# Patient Record
Sex: Female | Born: 1960 | Race: White | Hispanic: No | Marital: Married | Smoking: Never smoker
Health system: Southern US, Community
[De-identification: ages and names within clinical notes are randomized; demographics above are authoritative.]

## PROBLEM LIST (undated history)

## (undated) DIAGNOSIS — F329 Major depressive disorder, single episode, unspecified: Secondary | ICD-10-CM

## (undated) DIAGNOSIS — E785 Hyperlipidemia, unspecified: Secondary | ICD-10-CM

## (undated) DIAGNOSIS — F419 Anxiety disorder, unspecified: Secondary | ICD-10-CM

## (undated) DIAGNOSIS — M797 Fibromyalgia: Secondary | ICD-10-CM

## (undated) DIAGNOSIS — H269 Unspecified cataract: Secondary | ICD-10-CM

## (undated) DIAGNOSIS — K219 Gastro-esophageal reflux disease without esophagitis: Secondary | ICD-10-CM

## (undated) DIAGNOSIS — F32A Depression, unspecified: Secondary | ICD-10-CM

## (undated) DIAGNOSIS — K8020 Calculus of gallbladder without cholecystitis without obstruction: Secondary | ICD-10-CM

## (undated) DIAGNOSIS — T7840XA Allergy, unspecified, initial encounter: Secondary | ICD-10-CM

## (undated) DIAGNOSIS — K589 Irritable bowel syndrome without diarrhea: Secondary | ICD-10-CM

## (undated) HISTORY — DX: Irritable bowel syndrome, unspecified: K58.9

## (undated) HISTORY — PX: LAPAROSCOPY: SHX197

## (undated) HISTORY — DX: Depression, unspecified: F32.A

## (undated) HISTORY — DX: Unspecified cataract: H26.9

## (undated) HISTORY — PX: CATARACT EXTRACTION: SUR2

## (undated) HISTORY — DX: Fibromyalgia: M79.7

## (undated) HISTORY — PX: WRIST FRACTURE SURGERY: SHX121

## (undated) HISTORY — PX: CHOLECYSTECTOMY: SHX55

## (undated) HISTORY — DX: Allergy, unspecified, initial encounter: T78.40XA

## (undated) HISTORY — PX: OVARIAN CYST REMOVAL: SHX89

## (undated) HISTORY — DX: Gastro-esophageal reflux disease without esophagitis: K21.9

## (undated) HISTORY — DX: Hyperlipidemia, unspecified: E78.5

## (undated) HISTORY — DX: Anxiety disorder, unspecified: F41.9

## (undated) HISTORY — DX: Major depressive disorder, single episode, unspecified: F32.9

## (undated) HISTORY — DX: Calculus of gallbladder without cholecystitis without obstruction: K80.20

---

## 1998-11-12 ENCOUNTER — Other Ambulatory Visit: Admission: RE | Admit: 1998-11-12 | Discharge: 1998-11-12 | Payer: Self-pay | Admitting: Obstetrics and Gynecology

## 1999-09-06 ENCOUNTER — Inpatient Hospital Stay (HOSPITAL_COMMUNITY): Admission: EM | Admit: 1999-09-06 | Discharge: 1999-09-10 | Payer: Self-pay | Admitting: Surgery

## 1999-09-06 ENCOUNTER — Encounter (INDEPENDENT_AMBULATORY_CARE_PROVIDER_SITE_OTHER): Payer: Self-pay | Admitting: Specialist

## 1999-10-07 ENCOUNTER — Encounter: Admission: RE | Admit: 1999-10-07 | Discharge: 1999-10-07 | Payer: Self-pay | Admitting: Family Medicine

## 1999-10-20 ENCOUNTER — Encounter: Admission: RE | Admit: 1999-10-20 | Discharge: 1999-10-20 | Payer: Self-pay | Admitting: Family Medicine

## 1999-10-20 ENCOUNTER — Encounter: Payer: Self-pay | Admitting: Family Medicine

## 2000-11-29 ENCOUNTER — Other Ambulatory Visit: Admission: RE | Admit: 2000-11-29 | Discharge: 2000-11-29 | Payer: Self-pay | Admitting: Obstetrics and Gynecology

## 2001-12-24 ENCOUNTER — Other Ambulatory Visit: Admission: RE | Admit: 2001-12-24 | Discharge: 2001-12-24 | Payer: Self-pay | Admitting: Obstetrics and Gynecology

## 2002-03-07 ENCOUNTER — Encounter (INDEPENDENT_AMBULATORY_CARE_PROVIDER_SITE_OTHER): Payer: Self-pay | Admitting: *Deleted

## 2002-03-07 ENCOUNTER — Ambulatory Visit (HOSPITAL_COMMUNITY): Admission: RE | Admit: 2002-03-07 | Discharge: 2002-03-07 | Payer: Self-pay | Admitting: Obstetrics and Gynecology

## 2003-01-07 ENCOUNTER — Other Ambulatory Visit: Admission: RE | Admit: 2003-01-07 | Discharge: 2003-01-07 | Payer: Self-pay | Admitting: Obstetrics and Gynecology

## 2004-01-26 ENCOUNTER — Other Ambulatory Visit: Admission: RE | Admit: 2004-01-26 | Discharge: 2004-01-26 | Payer: Self-pay | Admitting: Obstetrics and Gynecology

## 2005-02-22 ENCOUNTER — Other Ambulatory Visit: Admission: RE | Admit: 2005-02-22 | Discharge: 2005-02-22 | Payer: Self-pay | Admitting: Obstetrics and Gynecology

## 2005-08-17 ENCOUNTER — Encounter: Admission: RE | Admit: 2005-08-17 | Discharge: 2005-08-17 | Payer: Self-pay | Admitting: Otolaryngology

## 2007-04-25 HISTORY — PX: VAGINAL HYSTERECTOMY: SUR661

## 2007-05-07 ENCOUNTER — Ambulatory Visit: Payer: Self-pay | Admitting: Internal Medicine

## 2007-05-07 LAB — CONVERTED CEMR LAB
BUN: 9 mg/dL (ref 6–23)
Basophils Absolute: 0 10*3/uL (ref 0.0–0.1)
Basophils Relative: 0.2 % (ref 0.0–1.0)
CO2: 30 meq/L (ref 19–32)
Calcium: 9.8 mg/dL (ref 8.4–10.5)
Chloride: 105 meq/L (ref 96–112)
Creatinine, Ser: 0.8 mg/dL (ref 0.4–1.2)
Eosinophils Absolute: 0.1 10*3/uL (ref 0.0–0.6)
Eosinophils Relative: 1.5 % (ref 0.0–5.0)
GFR calc Af Amer: 100 mL/min
GFR calc non Af Amer: 82 mL/min
Glucose, Bld: 88 mg/dL (ref 70–99)
HCT: 39.8 % (ref 36.0–46.0)
Hemoglobin: 13.8 g/dL (ref 12.0–15.0)
Lymphocytes Relative: 17.9 % (ref 12.0–46.0)
MCHC: 34.7 g/dL (ref 30.0–36.0)
MCV: 93.2 fL (ref 78.0–100.0)
Monocytes Absolute: 0.5 10*3/uL (ref 0.2–0.7)
Monocytes Relative: 6.5 % (ref 3.0–11.0)
Neutro Abs: 5.5 10*3/uL (ref 1.4–7.7)
Neutrophils Relative %: 73.9 % (ref 43.0–77.0)
Platelets: 416 10*3/uL — ABNORMAL HIGH (ref 150–400)
Potassium: 4.9 meq/L (ref 3.5–5.1)
RBC: 4.27 M/uL (ref 3.87–5.11)
RDW: 12.1 % (ref 11.5–14.6)
Sodium: 140 meq/L (ref 135–145)
WBC: 7.4 10*3/uL (ref 4.5–10.5)

## 2007-05-09 ENCOUNTER — Ambulatory Visit: Payer: Self-pay | Admitting: Internal Medicine

## 2007-05-09 ENCOUNTER — Encounter: Payer: Self-pay | Admitting: Internal Medicine

## 2007-05-15 ENCOUNTER — Encounter (INDEPENDENT_AMBULATORY_CARE_PROVIDER_SITE_OTHER): Payer: Self-pay | Admitting: Obstetrics and Gynecology

## 2007-05-15 ENCOUNTER — Ambulatory Visit (HOSPITAL_COMMUNITY): Admission: RE | Admit: 2007-05-15 | Discharge: 2007-05-16 | Payer: Self-pay | Admitting: Obstetrics and Gynecology

## 2007-07-30 ENCOUNTER — Ambulatory Visit: Payer: Self-pay | Admitting: Internal Medicine

## 2008-04-06 DIAGNOSIS — F329 Major depressive disorder, single episode, unspecified: Secondary | ICD-10-CM | POA: Insufficient documentation

## 2008-04-06 DIAGNOSIS — F411 Generalized anxiety disorder: Secondary | ICD-10-CM | POA: Insufficient documentation

## 2008-04-06 DIAGNOSIS — IMO0001 Reserved for inherently not codable concepts without codable children: Secondary | ICD-10-CM | POA: Insufficient documentation

## 2008-04-06 DIAGNOSIS — K589 Irritable bowel syndrome without diarrhea: Secondary | ICD-10-CM | POA: Insufficient documentation

## 2008-04-06 DIAGNOSIS — F3289 Other specified depressive episodes: Secondary | ICD-10-CM | POA: Insufficient documentation

## 2009-03-02 ENCOUNTER — Ambulatory Visit (HOSPITAL_BASED_OUTPATIENT_CLINIC_OR_DEPARTMENT_OTHER): Admission: RE | Admit: 2009-03-02 | Discharge: 2009-03-02 | Payer: Self-pay | Admitting: Orthopedic Surgery

## 2011-04-06 LAB — POCT HEMOGLOBIN-HEMACUE: Hemoglobin: 13.1 g/dL (ref 12.0–15.0)

## 2011-05-09 NOTE — Assessment & Plan Note (Signed)
[facility]                         GASTROENTEROLOGY OFFICE NOTE   BRITTENY, FIEBELKORN                       MRN:          [id number]  DATE:07/30/2007                            DOB:          1961-06-11    HISTORY:  Patient presents today for followup.  She is a 50 year old  with a history of fibromyalgia, irritable bowel syndrome,  anxiety/depression, a prior cholecystectomy.  She was evaluated May 07, 2007 for problems with diarrhea.  CBC and comprehensive metabolic panel  were unremarkable.  Stool studies were negative.  She underwent complete  colonoscopy with intubation of the terminal ileum.  This was  unremarkable.  Random biopsies of the normal-appearing colonic mucosa  were unremarkable.  No evidence of microscopic colitis.  She was treated  with Lomotil, and asked to follow up after she recovered from her  hysterectomy.  She recovered uneventfully from her hysterectomy.  She  states she is having some problems with depression and stress.  Lomotil  did seem to help her diarrhea.  She was reluctant to take it regularly.  She had 3 weeks prior to going to work where she states she felt well  with no bowel complaints.  However, since going back to work she  continues with diarrhea.  This can occur most any time of the day.  It  seems to be exacerbated by meals.  Her appetite has been a bit off, and  she had lost a bit more weight.  No bleeding, fevers, or other problems.   MEDICATIONS:  Unchanged, and include Cymbalta, multivitamin, and  multiple supplements.  She was recently started on Premarin post  hysterectomy.   PHYSICAL EXAMINATION:  Finds a well-appearing female, in no acute  distress.  Blood pressure is 116/66.  Heart rate is 92.  Weight is 127.8 pounds  (decreased 6.4 pounds).  HEENT:  Sclerae anicteric.  Oral mucosa intact.  ABDOMEN:  Soft without tenderness, mass, or hernia.   IMPRESSION:  Ongoing problems with diarrhea.   Working diagnosis is  irritable bowel syndrome.  However, her weight loss might raise the  question of other, organic, causes.   RECOMMENDATIONS:  1. Encouraged to use Lomotil.  2. Empiric treatment with Flagyl 250 mg t.i.d. for 1 week.  3. Probiotic Align 1 p.o. daily for 2 weeks.  4. Office visit in 4 to 6 weeks.  If the patient is continuing to have      problems, then it may be reasonable to check a tissue      transglutaminase antibody as well as extend the workup for      diarrhea.     Wilhemina Bonito. Marina Goodell, MD  Electronically Signed   JNP/MedQ  DD: 07/30/2007  DT: 07/30/2007  Job #: [id number]   cc:   Gwen Pounds, MD  Juluis Mire, M.D.

## 2011-05-09 NOTE — Assessment & Plan Note (Signed)
[facility]                         GASTROENTEROLOGY OFFICE NOTE   Jillian Rodriguez, Jillian Rodriguez                       MRN:          [id number]  DATE:05/07/2007                            DOB:          July 28, 1961    REASON FOR CONSULTATION:  Diarrhea.   HISTORY:  This is a 50 year old white female with a history of  fibromyalgia, irritable bowel syndrome, anxiety/depression and prior  cholecystectomy.  She is referred through the courtesy of Dr. Timothy Lasso  regarding diarrhea.  The patient reports being diagnosed with irritable  bowel syndrome in 1994.  Her symptoms were allegedly secondary to job-  related stress.  After situational stress resolved, symptoms resolved.  She has, however, had intermittent problems with diarrhea and bloating  most of her life.  Also intermittent problems with constipation.  Over  the past 6 months, she has had more problems with diarrhea.  She  attributes this to the stress of a new job which she took in November.  She states she has difficulties with her boss.  She describes stress or  food-related urgency with loose stools.  Also describes burning  discomfort throughout the abdomen.  She is not specific.  She has had  decreased appetite and a 4-pound weight loss.  She has about 4-6 bowel  movements were day, occasionally bowel movements at night.  She denies  recent antibiotic exposure, exposure to persons with similar problems or  travel abroad.  She does not use sugar free foods or substitutes.  She  did take medical leave of absence on April 15, 2007.  Despite being off  work for several weeks, her symptoms have persisted.  GI review of  systems is otherwise remarkable for occasional reflux for which over-the-  counter medications help.  No dysphagia.  She has had no work-up  regarding this problem.  No prior endoscopic evaluations.  The patient  reports being scheduled for hysterectomy next week because of problems  with  symptomatic fibroids.   PAST MEDICAL HISTORY:  As above.   PAST SURGICAL HISTORY:  1. Cholecystectomy in September of 2000.  2. Tubal ligation about 3 years ago.  3. Tonsillectomy as a child.  4. History of uterine polyp removal.  5. Cesarean section.   ALLERGIES:  Possibly PENICILLIN.   CURRENT MEDICATIONS:  1. Cymbalta 120 mg daily.  2. Multivitamin.  3. Vitamin C.  4. Vitamin E.  5. Magnesium.  6. Potassium.  7. Calcium.  8. Fibro-X.  9. Loestrin.   FAMILY HISTORY:  Father with colon polyps.  No inflammatory bowel  disease.   SOCIAL HISTORY:  The patient is married with one son.  She lives with  her husband.  She graduated high school.  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  She does not smoke or use alcohol.   REVIEW OF SYSTEMS:  Multiple entries per diagnostic evaluation form.   PHYSICAL EXAMINATION:  GENERAL:  Well-appearing female in no acute  distress.  VITAL SIGNS:  Blood pressure 110/74, heart rate 80, weight 134.2 pounds.  She is 5 feet, 7 inches in  height.  HEENT:  Sclerae anicteric.  Conjunctivae are pink.  Oral mucosa intact.  There is no adenopathy.  LUNGS:  Clear.  HEART:  Regular.  ABDOMEN:  Soft without tenderness, mass or hernia.  EXTREMITIES:  Without edema.   IMPRESSION:  Chronic abdominal complaints, possibly irritable bowel.  More recent problems with loose stools, pain and mild weight loss with  some nocturnal component.  Rule out organic causes for symptoms.  Infectious etiology should be excluded.  As well,  rule out macroscopic  or microscopic colitis.   PLAN:  1. Laboratories today including CBC and comprehensive metabolic panel.  2. Stool studies for enteric pathogens, ova and parasites and      Clostridium difficile toxin.  3. Schedule colonoscopy with biopsies as soon as possible.  The nature      of the procedure, as well as the risks, benefits and alternatives      were reviewed.  She understood and agreed  to proceed.  4. Ongoing general medical care with Dr. Timothy Lasso and gynecologic care      with Dr. Arelia Sneddon.     Wilhemina Bonito. Marina Goodell, MD  Electronically Signed    JNP/MedQ  DD: 05/07/2007  DT: 05/08/2007  Job #: [id number]   cc:   Gwen Pounds, MD  Juluis Mire, M.D.

## 2011-05-09 NOTE — Op Note (Signed)
Jillian Rodriguez, Jillian Rodriguez NO.:  [id number]   MEDICAL RECORD NO.:  [id number]          PATIENT TYPE:  AMB   LOCATION:  DSC                          FACILITY:  [facility]   PHYSICIAN:  Cindee Salt, M.D.       DATE OF BIRTH:  October 05, 1961   DATE OF PROCEDURE:  03/02/2009  DATE OF DISCHARGE:                               OPERATIVE REPORT   PREOPERATIVE DIAGNOSIS:  Status post open reduction and internal  fixation with distal radius fracture DVR plate with wrist pain.   POSTOPERATIVE DIAGNOSIS:  Status post open reduction and internal  fixation with distal radius fracture DVR plate with wrist pain.   OPERATION:  Removal of DVR plate with debridement of FPL tendon, right  wrist.   SURGEON:  Cindee Salt, MD   ASSISTANT:  Carolyne Fiscal RN   ANESTHESIA:  General with local infiltration.   ANESTHESIOLOGIST:  Maren Beach, MD.   HISTORY:  The patient is a 49 year old female who 3 years ago suffered a  fracture her right distal radius.  She underwent open reduction and  internal fixation with DVR plate.  She recently had pain and swelling in  her forearm.  X-rays, CT scan revealed that the plate was slightly  distal.  Diagnosis was felt that she was abrading flexor tendons and  removal of plate should be performed.  Preoperative and postoperative  course have been discussed along with risks and complications.  She is  aware that there is no guarantee with the surgery, possibility of  infection, recurrence injury to arteries, nerves, tendons, incomplete  relief of symptoms, dystrophy.  Preoperative area, the patient is seen.  The extremity marked by both the patient and surgeon.  Antibiotic given.   PROCEDURE:  The patient was brought to the operating room where a  general anesthetic was carried out without difficulty under the  direction of Dr. Katrinka Blazing.  She was prepped using DuraPrep, supine  position, right arm free.  Time-out was taken confirming the patient and  procedure.  The  limb was exsanguinated with an Esmarch bandage.  Tourniquet was placed high and the arm was inflated to 250 mmHg.  The  old incision was used carried down through subcutaneous tissue.  Blood  was immediately apparent in the flexor carpi radialis tendon sheath  along with the FPL tendon sheath.  Dissection was carried down.  The  pronator quadratus was found to be atrophic.  This was then incised,  elevated off from the plate.  Significant blood was present around the  entire FPL tendon and muscle.  The undersurface of the FPL tendon was  inspected.  This was found to be abraded.  There was a hole in the  radial margin of the plate.  The tendon was minimally debrided.  The  plate was then removed without difficulty.  Periosteum was left intact.  The fracture was noted to be fully healed.  The periosteum was then  closed after irrigation and debridement of hypertrophic bone with figure-  of-eight 4-0 Vicryl sutures.  Subcutaneous tissue was closed with  interrupted 4-0 Vicryl and  the skin with a subcuticular 5-0 Vicryl  Rapide suture after infiltrating the area with 0.25% Marcaine without  epinephrine.  The completion of the closure, a sterile compressive  dressing and splint  to the wrist was applied.  The patient tolerated the procedure well and  was taken to the recovery room for observation in satisfactory  condition.  She will be discharged to home to return to the [facility]  [facility] [employer] in 1 week on Vicodin.           ______________________________  Cindee Salt, M.D.     GK/MEDQ  D:  03/02/2009  T:  03/02/2009  Job:  [id number]

## 2011-05-12 NOTE — H&P (Signed)
NAME:  Jillian Rodriguez, Jillian Rodriguez NO.:  [id number]   MEDICAL RECORD NO.:  [id number]          PATIENT TYPE:  AMB   LOCATION:  SDC                           FACILITY:  [facility]   PHYSICIAN:  Juluis Mire, M.D.   DATE OF BIRTH:  April 02, 1961   DATE OF ADMISSION:  DATE OF DISCHARGE:                              HISTORY & PHYSICAL   The patient is a 50 year old, gravida 3, para 1, abortus 2, married  female who presents for laparoscopic-assisted vaginal hysterectomy with  bilateral salpingo-oophorectomy.   In relation to present admission, cycles are regular.  At the present  time, she continues to have extremely heavy flow with her cycles with  clots.  She is also having pre- and post-menstrual spotting.  She has  had a previous laparoscopic evaluation done for tubal in the past with  finding of pelvic endometriosis.  She also had a hysteroscopy at that  time with removal of a benign polyp.  Subsequently, we did a repeat  saline infusion ultrasound.  She had multiple small fibroids, some of  which were submucosal.  She had bilateral ovarian cyst and a possible  recurrent polyp.  In view of these findings, we had discussed options  with her.  This included hormonal suppression, outpatient surgical  evaluation or hysterectomy which the patient is in favor for.  She  wishes to have both ovaries removed at the same time in order to have a  definitive cure for endometriosis.  She does know the potential for  vasomotor symptomatology that could require estrogen-replacement therapy  with associated risks and benefits.   IN TERMS OF ALLERGIES, SHE IS ALLERGIC PENICILLIN.   MEDICATIONS:  Cymbalta.   PAST MEDICAL HISTORY:  She has had usual childhood disease without any  significant sequelae.  She is being followed for fibromyalgia.   PAST SURGICAL HISTORY:  She had exploratory laparotomy in 1987 with  removal of a 6- to 7-cm ovarian cyst that was benign.  Also in 2003, she  had  laparoscopy with bilateral tubal ligation, treatment of  endometriosis, hysteroscopy, removal of benign polyp.   OBSTETRICAL HISTORY:  She has had three TABs and 1 cesarean section.   FAMILY HISTORY:  Noncontributory.   SOCIAL HISTORY:  Reveals no tobacco or alcohol use.   REVIEW OF SYSTEMS:  Noncontributory.   PHYSICAL EXAMINATION:  The patient is afebrile with stable vital signs.  HEENT EXAM:  The patient is normocephalic.  Pupils equal, round and  reactive to light and accommodation.  Extraocular movements were intact.  Sclerae and conjunctivae are clear.  Oropharynx clear.  NECK:  Without thyromegaly.  BREASTS:  No discrete masses.  LUNGS:  Are clear.  CARDIOVASCULAR SYSTEM:  Regular rhythm and rate without murmurs or  gallops.  ABDOMINAL EXAM:  Is benign.  No mass, organomegaly or  tenderness.  Well-healed low transverse incision.  PELVIC:  Normal external genitalia.  Vaginal mucosa clear.  Cervix  remarkable.  Uterus upper limits of normal size, irregular.  Adnexa  unremarkable.  EXTREMITIES:  Trace edema.  NEUROLOGICAL EXAM:  Grossly within normal limits.  IMPRESSION:  Menorrhagia with known uterine fibroid, endometrial polyps  and pelvic endometriosis.   PLAN:  After discussion of options, we are going to proceed with  laparoscopic-assisted vaginal hysterectomy with bilateral salpingo-  oophorectomy.  The risk have been discussed including the risk of  infection.  Risk of vascular injury that could lead to hemorrhage  requiring transfusion with the risk of AIDS or hepatitis, risk of injury  to adjacent organs including bladder, bowel, ureters that could require  further exploratory surgery.  Risk of deep venous thrombosis and  pulmonary emboli.  The patient expressed understanding of indications  and risks.      Juluis Mire, M.D.  Electronically Signed     JSM/MEDQ  D:  05/15/2007  T:  05/15/2007  Job:  [id number]

## 2011-05-12 NOTE — Op Note (Signed)
NAMELEYDY, WORTHEY NO.:  [id number]   MEDICAL RECORD NO.:  [id number]          PATIENT TYPE:  OIB   LOCATION:  9303                          FACILITY:  [facility]   PHYSICIAN:  Juluis Mire, M.D.   DATE OF BIRTH:  November 22, 1961   DATE OF PROCEDURE:  05/15/2007  DATE OF DISCHARGE:                               OPERATIVE REPORT   PREOPERATIVE DIAGNOSES:  1. Pelvic endometriosis.  2. Uterine fibroids.   POSTOPERATIVE DIAGNOSIS:  __________ right ovarian endometrioma.   OPERATIVE PROCEDURE:  Open laparoscopy, laparoscopic-assisted vaginal  hysterectomy with bilateral salpingo-oophorectomy.  Cystoscopy.   SURGEON:  Juluis Mire, M.D.   ASSISTANT:  Duke Salvia. Marcelle Overlie, M.D.   ANESTHESIA:  Was general endotracheal.   ESTIMATED BLOOD LOSS:  400 mL.   PACKS AND DRAINS:  None.   INTRAOPERATIVE BLOOD REPLACED:  None.   COMPLICATIONS:  None.   INDICATIONS:  Are noted in history and physical.   PROCEDURE WAS AS FOLLOWS:  The patient was taken to OR and placed in the  supine position.  After satisfactory level of general endotracheal  anesthesia was obtained, the patient was placed in a dorsal lithotomy  position using the Allen stirrups.  The abdomen, perineum and vagina  were prepped out with Betadine.  Bladder was emptied by in-and-out  catheterization.  A Hulka tenaculum was put in place and secured.  Patient was then draped in a sterile field.  Subumbilical incision made  with a knife and extended through the subcutaneous tissue.  Fascia was  entered sharply and incision in the fascia extended laterally.  Perineum  was entered with blunt pressure.  Open laparoscopic trocar was put in  place and secured.  The scope was introduced.  There was no evidence of  injury to adjacent organs.  Visualization revealed a uterus enlarged  with multiple fibroids.  Right ovary had apparent endometrioma.  Left  ovary was unremarkable.  She had a previous bilateral tubal  ligation.  Appendix was visualized and noted be normal.  Upper abdomen including  liver and tip of the gallbladder were clear.  We put in a 5-mm trocar in  the suprapubic area in the left lower quadrant after visualization of  the epigastric vessels.  We first attempted to isolate the right ovary  due to the size of the uterus and the large endometrioma.  We had  difficulty coming across the vasculature, so the decision was to leave  the ovary in place and do the hysterectomy and then come back and take  it out.  Therefore, the right utero-ovarian pedicle was cauterized  incised.  Right round ligament was cauterized incised.  We then went to  the left side.  The left ovary was elevated. The ureter could be seen,  its course below the ovary.  The ovarian vasculature was cauterized  incised.  Mesenteric attachment of the ovary and tube was cauterized  incised, and the left round ligament was cauterized incised.  At this  point in time, we had good hemostasis and decided to proceed vaginally.   At this point in  time, the abdomen was deflated of its carbon dioxide.  Laparoscope was removed.  The patient's legs were repositioned.  The  Hulka tenaculum was then taken out.  A weighted speculum was in the  vaginal vault.  The cervix was grasped with a Christella Hartigan tenaculum.  Cul-de-  sac was entered sharply.  Both uterosacral ligaments were clamped, cut  and suture ligated with 0 Vicryl.  The reflection of the vaginal mucosa  anteriorly was incised, and the bladder was dissected superiorly.  Paracervical tissue was clamped, cut and suture ligated with 0 Vicryl.  Using clamp, cut, tying technique with suture ligature of 0 Vicryl, the  parametrium was serially separated from the sides of the uterus.  We  could not exactly enter the vesicouterine space, but the bladder did  dissect up superiorly nicely.  We then began morcellating.  We first  excised the cervix and the lower uterine segment.  Then  excised portions  of the myometrium.  At this point, the main uterine fundus was delivered  through the vaginal incision.  Remaining pedicles were clamped and cut,  and the fundus was passed off the operative field along with the left  ovary.  These were sent for pathology.  Held pedicle was secured with  free ties of 0 Vicryl.  Areas of bleeding brought under control with  figure-of-eights of 0 Vicryl.  At this point in time, the vaginal mucosa  was reapproximated in a vertical fashion with interrupted figure-of-  eights of 0 Monocryl.  Foley was placed to straight drain.  We had clear  urine.   The patient's legs were repositioned.  Laparoscope was reintroduced.  At  this point in time, we were able to elevate the right ovary, identify  the ovarian vasculature above the ureter, and using cautery incision, we  separated the ovary from its sidewall attachment and was removed intact  through the umbilicus.  At this point in time, we thoroughly irrigated  the pelvis and had fairly good hemostasis.   At this point in time, cystoscopy was performed.  The patient had to be  given indigo carmine.  Bladder was intact with no evidence of injury.  Both ureteral orifices were visualized and noted to be spilling blue-  tinged urine.  The cystoscope was removed.  Foley was placed back to  straight drain.  Laparoscope was reintroduced.  Abdomen was deflated of  the carbon dioxide.  We had no active bleeding going on at the present  time.  We thoroughly irrigated the pelvis.  Abdomen was deflated of  carbon dioxide.  All trocars removed.  Subumbilical fascia was closed  with figure-of-eight of 0 Vicryl, skin with interrupted subcuticulars of  4-0 Vicryl.  Suprapubic incisions were closed with Dermabond.  Sponge,  instrument and needle count was reported as correct by circulating nurse x2.  Foley catheter was draining blue-tinged urine.  The patient was  extubated and transferred to the recovery room  in good condition.  The  patient tolerated the procedure well.      Juluis Mire, M.D.  Electronically Signed     JSM/MEDQ  D:  05/15/2007  T:  05/15/2007  Job:  [id number]

## 2011-05-12 NOTE — Discharge Summary (Signed)
[name], [name] NO.:  [id number]   MEDICAL RECORD NO.:  [id number]          PATIENT TYPE:  OIB   LOCATION:  9303                          FACILITY:  [facility]   PHYSICIAN:  Juluis Mire, M.D.   DATE OF BIRTH:  1961-07-04   DATE OF ADMISSION:  05/15/2007  DATE OF DISCHARGE:  05/16/2007                               DISCHARGE SUMMARY   PREOPERATIVE DIAGNOSES:  1. Endometriosis.  2. Uterine fibroids.   DISCHARGE DIAGNOSES:  1. Endometriosis.  2. Uterine fibroids.   OPERATIVE PROCEDURE:  Laparoscopic assisted vaginal hysterectomy with  bilateral salpingo-oophorectomy, cystoscopy.   For complete History and Physical, please see dictated note.   COURSE IN THE HOSPITAL:  The patient underwent the above-noted surgery.  She did relatively well.  Postoperatively she stayed overnight.  The  next morning her hemoglobin was 9.8, but she was relatively stable.  Her  Foley had been discontinued.  She was voiding without difficulty.  She  was tolerating a regular diet, bowel sounds were active, and she had  actually passed flatus.  Her incisions were clear.  She had no active  vaginal bleeding.  Did complain of some headache and some abdominal  discomfort.   In terms of complications, none.   Discharge home in stable condition.   DISPOSITION:  Patient is to avoid heavy lifting, vaginal entrance, be  careful driving her car.  She is to watch for signs of infection,  nausea, vomiting, increasing abdominal or pelvic pain, active vaginal  bleeding, signs of deep venous thrombosis or pulmonary embolus.  She  will be discharged home on Percocet she needs for pain.  Reassessment in  the office in one week.      Juluis Mire, M.D.  Electronically Signed     JSM/MEDQ  D:  05/16/2007  T:  05/16/2007  Job:  [id number]

## 2011-05-12 NOTE — Op Note (Signed)
[facility]  Patient:    Jillian Rodriguez, Jillian Rodriguez Visit Number: [id number] MRN: [id number]          Service Type: DSU Location: [facility] Attending Physician:  Frederich Balding Dictated by:   Juluis Mire, M.D. Proc. Date: 03/07/02 Admit Date:  03/07/2002 Discharge Date: 03/07/2002                             Operative Report  PREOPERATIVE DIAGNOSES:       1. Multiparity.                               2. Desires sterility.                               3. Abnormal uterine bleeding.                               4. Pelvic pain, rule out endometriosis.  POSTOPERATIVE DIAGNOSES:      1. Multiparity.                               2. Desires sterility.                               3. Abnormal uterine bleeding.                               4. Pelvic pain, rule out endometriosis.                               5. Moderate pelvic endometriosis.                               6. Multiple uterine polyps.                               7. Pelvic adhesions.  OPERATION:                    1. Hysteroscopy with resection of multiple                                  endometrial polyps.                               2. Diagnostic laparoscopy with bilateral tubal                                  fulguration.                               3. Lysis of adhesions.  4. Laser ablation of active endometriosis.  SURGEON:                      Juluis Mire, M.D.  ANESTHESIA:                   General endotracheal.  ESTIMATED BLOOD LOSS:         Minimal.  PACKS AND DRAINS:             None.  INTRAOPERATIVE BLOOD REPLACEMENT:                  None.  COMPLICATIONS:                None.  INDICATIONS:                  Noted in history and physical.  DESCRIPTION OF PROCEDURE:     The patient was taken to the OR and placed in the supine position. After satisfactory level of general endotracheal anesthesia was obtained, the patient was placed in  the dorsal lithotomy position using the Allen stirrups. The abdomen, perineum, and vagina were prepped out with Betadine. The patient was draped out for hysteroscopy. Speculum was placed in the vaginal vault. The cervix was grabbed with a single-tooth tenaculum. Uterus sounded to approximately 8 cm. The cervix was serially dilated to a size 35 Pratt dilator. The operative hysteroscope was introduced. Intrauterine cavity was involved with multiple uterine fibroids as noted through the hysteroscope. These were resected and sent for pathological review. There was no evidence of uterine perforation or active bleeding. Endometrial curettings were also obtained and sent for pathological review. At this point in time, we had no active bleeding or complications. A Hulka tenaculum was put in place, the single-tooth tenaculum and speculum then removed. The bladder was drained by in-and-out catheterization. The patient was the made ready for laparoscopy. A subumbilical incision was made with the knife. The Veress needle was introduced into the abdominal cavity. The abdomen was inflated with approximately 3 liters of carbon dioxide. The operating laparoscope was introduced. There was no evidence of injury to adjacent organs. The 5 mm trocar was put in place in the suprapubic area under direct visualization. Visualization revealed a normal appendix, upper abdomen including liver tip and gallbladder unremarkable. The exploration of the pelvic cavity revealed active endometriosis involving the bladder flap, cul-de-sac area and both ovaries. The right ovary was somewhat adherent from the previous ovarian cystectomy. First we went to the bilateral tubal ligation. Using a bipolar, a mid segment on each tube was grasped and coagulated for a distance of 2 cm. Coagulation was continued until the resistance read zero on the meter. Coagulation did extend out to the mesosalpinx. We then recoagulated the same  segment of tube completely desiccating each segment of tube. At the end of the procedure, both tubes were adequately coagulated. Next, the YAG laser was brought in with the rounded sapphire tip. First, we took down the adhesion involving the right ovary. Next, using the YAG laser 15 watts continuous mode, the area of endometriosis on the bladder flap area was coagulated. We made sure that we did not enter the bladder itself. We then coagulated the areas of endometriosis in the cul-de-sac and involving mainly the right ovary. There was no real endometrioma of the right ovary. Of note, there were no pelvic adhesions except to the right ovary. Classification of endometriosis would probably be mild to moderate. At the  end of the procedure, all active endometriosis was then adequately ablated. There was no active bleeding or signs of injury to adjacent organs. The abdomen was deflated of its carbon dioxide. All trocars were removed. Subumbilical incision was closed with interrupted subcuticular of 4-0 Vicryl, the suprapubic incisions closed with Steri-Strips. The Hulka tenaculum was then removed. Sponge, instrument, and needle count were reported as correct by circulating nurse x 2. The patient tolerated the procedure well, was extubated, and transferred to recovery room in good condition. Dictated by:   Juluis Mire, M.D. Attending Physician:  Frederich Balding DD:  04/22/02 TD:  04/22/02 Job: [id number]

## 2011-05-12 NOTE — H&P (Signed)
[facility]  Patient:    Jillian Rodriguez, Jillian Rodriguez Visit Number: [id number] MRN: [id number]          Service Type: Attending:  Juluis Mire, M.D. Dictated by:   Juluis Mire, M.D. Adm. Date:  03/07/02                           History and Physical  REASON FOR ADMISSION:         The patient is a 50 year old gravida 4 para 1 abortus 3 married white female who presents for laparoscopic bilateral tubal ligation as well as hysteroscopy.  HISTORY OF PRESENT ILLNESS:   The patient is desirous of permanent sterilization.  We have discussed with her alternatives for birth control purposes.  The potential irreversibility of sterilization is explained.  A failure rate of 1/200 is quoted.  Failures can be in the form of ectopic pregnancies requiring further surgical management.  She also has noted some abnormal bleeding.  Her cycle lengths are 30 to 32 days.  She is having pre and postmenstrual spotting.  We are going to do hysteroscopy to make sure there are no endometrial polyps or other etiology of the bleeding pattern. Finally, there are some problems with pelvic pain.  There has been some concern about endometriosis.  Therefore, laser standby will be available.  ALLERGIES:                    PENICILLIN.  MEDICATIONS:                  Lexapro.  PAST MEDICAL HISTORY:         Usual childhood diseases without any significant sequelae.  PAST SURGICAL HISTORY:        She did have an exploratory laparotomy in 1987 by Dr. Eda Paschal for evaluation of a 6-7 cm cyst.  This was removed and found to be benign.  There was no other pelvic pathology noted at that point in time.  It was felt that the uterus was slightly boggy, although no fibroid was identified.  OBSTETRICAL HISTORY:          She has had three TABs and one cesarean section.  FAMILY HISTORY:               Noncontributory.  SOCIAL HISTORY:               No tobacco or alcohol use.  REVIEW OF SYSTEMS:             Noncontributory.  PHYSICAL EXAMINATION:  VITAL SIGNS:                  Patient is afebrile with stable vital signs.  HEENT:                        Patient normocephalic.  Pupils equal, round, and reactive to light and accommodation.  Extraocular movements were intact. Sclerae and conjunctivae were clear.  Oropharynx was clear.  BREASTS:                      No discrete masses.  LUNGS:                        Clear.  CARDIOVASCULAR:               Regular rhythm and rate without murmurs or gallops.  ABDOMEN:  Benign.  No masses, organomegaly, or tenderness.  PELVIC:                       Normal external genitalia, vaginal mucosa is clear.  Cervix unremarkable.  Uterus normal size, shape, and contour.  Adnexa free of masses or tenderness.  EXTREMITIES:                  Trace edema.  NEUROLOGIC:                   Grossly within normal limits.  IMPRESSION:                   1. Desires sterility.                               2. Abnormal uterine bleeding.                               3. Pelvic pain, rule out endometriosis.  PLAN:                         The patient to undergo laparoscopic bilateral tubal ligation with laser standby, as well as hysteroscopy.  The risks have been discussed, including the risk of anesthesia; the risk of infection; the risk of vascular injury that could require transfusion with the risk of AIDS or hepatitis; the risk of injury to other organs including bladder or bowel that could require further exploratory surgery; the risk of deep venous thrombosis and pulmonary embolus.  The patient professed an understanding of the indications and risks. Dictated by:   Juluis Mire, M.D. Attending:  Juluis Mire, M.D. DD:  03/07/02 TD:  03/07/02 Job: [id number]

## 2014-11-25 ENCOUNTER — Ambulatory Visit (INDEPENDENT_AMBULATORY_CARE_PROVIDER_SITE_OTHER): Payer: BC Managed Care – PPO | Admitting: Emergency Medicine

## 2014-11-25 VITALS — BP 128/78 | HR 71 | Temp 98.2°F | Resp 16 | Ht 67.5 in | Wt 154.0 lb

## 2014-11-25 DIAGNOSIS — S93505A Unspecified sprain of left lesser toe(s), initial encounter: Secondary | ICD-10-CM

## 2014-11-25 DIAGNOSIS — M79675 Pain in left toe(s): Secondary | ICD-10-CM

## 2014-11-25 DIAGNOSIS — S93509A Unspecified sprain of unspecified toe(s), initial encounter: Secondary | ICD-10-CM

## 2014-11-25 MED ORDER — MELOXICAM 15 MG PO TABS: 15.0000 mg | ORAL_TABLET | Freq: Every day | ORAL | Status: DC

## 2014-11-25 NOTE — Progress Notes (Signed)
    MRN: [id number] DOB: 1961-09-25  Subjective:   Jillian Rodriguez is a 53 y.o. female presenting for 3 day history of new onset worsening 2nd left toe pain. Patient states that she first noticed the pain when she was stepping into her tub on Saturday, does not recall stubbing it or any other trauma. No new activities where she could have injured her toe. Today, patient reports that her pain is achy, moderate in severity, worsened with walking and associated with swelling in her toe. Denies any deformity, clicking, popping, erythema, ecchymosis, fevers, swelling of the foot. Has not tried any medications, no icing or heat. Does not smoke, no alcohol use. Denies any other aggravating or relieving factors, no other questions or concerns.   Prior to Admission medications   Medication Sig Start Date End Date Taking? Authorizing Provider  DULoxetine (CYMBALTA) 60 MG capsule Take 60 mg by mouth daily.   Yes Historical Provider, MD  estradiol (VIVELLE-DOT) 0.1 MG/24HR patch Place 1 patch onto the skin 2 (two) times a week.   Yes Historical Provider, MD  rOPINIRole (REQUIP) 0.25 MG tablet Take 0.25 mg by mouth 3 (three) times daily.   Yes Historical Provider, MD    Allergies  Allergen Reactions  . Penicillins Other (See Comments)    Unsure of reaction      Past Medical History  Diagnosis Date  . Allergy   . Depression   . Fibromyalgia     Past Surgical History  Procedure Laterality Date  . Cholecystectomy    . Cesarean section    . Abdominal hysterectomy      ROS As in subjective.  Objective:   Vitals: BP 128/78 mmHg  Pulse 71  Temp(Src) 98.2 F (36.8 C) (Oral)  Resp 16  Ht 5' 7.5" (1.715 m)  Wt 154 lb (69.854 kg)  BMI 23.75 kg/m2  SpO2 98%  Physical Exam  Constitutional: She is oriented to person, place, and time and well-developed, well-nourished, and in no distress.  Cardiovascular: Normal rate and intact distal pulses.   Pulmonary/Chest: Effort normal. No respiratory  distress.  Musculoskeletal: Normal range of motion. She exhibits edema (mild of 2nd left toe) and tenderness (mild-moderate of 2nd left toe).  Sensation intact, strength 5/5 in feet and toes bilaterally.  Neurological: She is alert and oriented to person, place, and time.  Skin: Skin is warm and dry. No rash noted. She is not diaphoretic. There is erythema (mild of 2nd left toe).   Assessment and Plan :   1. Toe sprain, initial encounter 2. Toe pain, left - Will manage conservatively, patient is okay holding off on the X-ray - Buddy tape for the next 2 weeks, meloxicam for pain, elevate foot while at work - Return to clinic if symptoms worsen or fail to resolve - meloxicam (MOBIC) 15 MG tablet; Take 1 tablet (15 mg total) by mouth daily.  Dispense: 30 tablet; Refill: 0   Jaynee Eagles, PA-C [facility] [phone] 11/25/2014 9:08 AM

## 2014-11-25 NOTE — Patient Instructions (Signed)
Buddy Taping of Toes °We have taped your toes together to keep them from moving. This is called "buddy taping" since we used a part of your own body to keep the injured part still. We placed soft padding between your toes to keep them from rubbing against each other. Buddy taping will help with healing and to reduce pain. Keep your toes buddy taped together for as long as directed by your caregiver. °HOME CARE INSTRUCTIONS  °· Raise your injured area above the level of your heart while sitting or lying down. Prop it up with pillows. °· An ice pack used every twenty minutes, while awake, for the first one to two days may be helpful. Put ice in a plastic bag and put a towel between the bag and your skin. °· Watch for signs that the taping is too tight. These signs may be: °¨ Numbness of your taped toes. °¨ Coolness of your taped toes. °¨ Color change in the area beyond the tape. °¨ Increased pain. °· If you have any of these signs, loosen or rewrap the tape. If you need to loosen or rewrap the buddy tape, make sure you use the padding again. °SEEK IMMEDIATE MEDICAL CARE IF:  °· You have worse pain, swelling, inflammation (soreness), drainage or bleeding after you rewrap the tape. °· Any new problems occur. °MAKE SURE YOU:  °· Understand these instructions. °· Will watch your condition. °· Will get help right away if you are not doing well or get worse. °Document Released: 09/14/2004 Document Revised: 03/04/2012 Document Reviewed: 12/08/2008 °ExitCare® Patient Information ©2015 [employer]. This information is not intended to replace advice given to you by your health care provider. Make sure you discuss any questions you have with your health care provider. ° °

## 2016-01-03 ENCOUNTER — Encounter: Payer: Self-pay | Admitting: Internal Medicine

## 2016-05-30 DIAGNOSIS — Z23 Encounter for immunization: Secondary | ICD-10-CM | POA: Diagnosis not present

## 2016-06-01 DIAGNOSIS — Z0000 Encounter for general adult medical examination without abnormal findings: Secondary | ICD-10-CM | POA: Diagnosis not present

## 2016-12-27 DIAGNOSIS — L570 Actinic keratosis: Secondary | ICD-10-CM | POA: Diagnosis not present

## 2016-12-27 DIAGNOSIS — L309 Dermatitis, unspecified: Secondary | ICD-10-CM | POA: Diagnosis not present

## 2016-12-27 DIAGNOSIS — D229 Melanocytic nevi, unspecified: Secondary | ICD-10-CM | POA: Diagnosis not present

## 2017-01-06 DIAGNOSIS — Z23 Encounter for immunization: Secondary | ICD-10-CM | POA: Diagnosis not present

## 2017-01-26 DIAGNOSIS — Z0000 Encounter for general adult medical examination without abnormal findings: Secondary | ICD-10-CM | POA: Diagnosis not present

## 2017-01-26 DIAGNOSIS — M859 Disorder of bone density and structure, unspecified: Secondary | ICD-10-CM | POA: Diagnosis not present

## 2017-01-26 DIAGNOSIS — R8299 Other abnormal findings in urine: Secondary | ICD-10-CM | POA: Diagnosis not present

## 2017-01-29 DIAGNOSIS — G2581 Restless legs syndrome: Secondary | ICD-10-CM | POA: Diagnosis not present

## 2017-01-29 DIAGNOSIS — Z1389 Encounter for screening for other disorder: Secondary | ICD-10-CM | POA: Diagnosis not present

## 2017-01-29 DIAGNOSIS — N3281 Overactive bladder: Secondary | ICD-10-CM | POA: Diagnosis not present

## 2017-01-29 DIAGNOSIS — R0683 Snoring: Secondary | ICD-10-CM | POA: Diagnosis not present

## 2017-01-29 DIAGNOSIS — R41840 Attention and concentration deficit: Secondary | ICD-10-CM | POA: Diagnosis not present

## 2017-01-29 DIAGNOSIS — Z0000 Encounter for general adult medical examination without abnormal findings: Secondary | ICD-10-CM | POA: Diagnosis not present

## 2017-03-21 ENCOUNTER — Encounter: Payer: Self-pay | Admitting: Internal Medicine

## 2017-05-10 DIAGNOSIS — Z01419 Encounter for gynecological examination (general) (routine) without abnormal findings: Secondary | ICD-10-CM | POA: Diagnosis not present

## 2017-05-10 DIAGNOSIS — Z1231 Encounter for screening mammogram for malignant neoplasm of breast: Secondary | ICD-10-CM | POA: Diagnosis not present

## 2017-05-10 DIAGNOSIS — Z6824 Body mass index (BMI) 24.0-24.9, adult: Secondary | ICD-10-CM | POA: Diagnosis not present

## 2017-07-23 DIAGNOSIS — Z0000 Encounter for general adult medical examination without abnormal findings: Secondary | ICD-10-CM | POA: Diagnosis not present

## 2017-07-24 ENCOUNTER — Ambulatory Visit (INDEPENDENT_AMBULATORY_CARE_PROVIDER_SITE_OTHER): Payer: BLUE CROSS/BLUE SHIELD | Admitting: Physician Assistant

## 2017-07-24 ENCOUNTER — Encounter: Payer: Self-pay | Admitting: Physician Assistant

## 2017-07-24 ENCOUNTER — Encounter (INDEPENDENT_AMBULATORY_CARE_PROVIDER_SITE_OTHER): Payer: Self-pay

## 2017-07-24 VITALS — BP 120/72 | HR 68 | Ht 67.5 in | Wt 160.0 lb

## 2017-07-24 DIAGNOSIS — K5900 Constipation, unspecified: Secondary | ICD-10-CM

## 2017-07-24 DIAGNOSIS — K644 Residual hemorrhoidal skin tags: Secondary | ICD-10-CM

## 2017-07-24 DIAGNOSIS — K5730 Diverticulosis of large intestine without perforation or abscess without bleeding: Secondary | ICD-10-CM

## 2017-07-24 DIAGNOSIS — K648 Other hemorrhoids: Secondary | ICD-10-CM

## 2017-07-24 DIAGNOSIS — K625 Hemorrhage of anus and rectum: Secondary | ICD-10-CM | POA: Diagnosis not present

## 2017-07-24 MED ORDER — SUPREP BOWEL PREP KIT 17.5-3.13-1.6 GM/177ML PO SOLN: ORAL | 0 refills | Status: DC

## 2017-07-24 MED ORDER — BENEFIBER PO POWD: ORAL | 0 refills | Status: DC

## 2017-07-24 NOTE — Patient Instructions (Signed)
You have been scheduled for a colonoscopy. Please follow written instructions given to you at your visit today.  Please pick up your prep supplies at the pharmacy within the next 1-3 days. If you use inhalers (even only as needed), please bring them with you on the day of your procedure. Your physician has requested that you go to www.startemmi.com and enter the access code given to you at your visit today. This web site gives a general overview about your procedure. However, you should still follow specific instructions given to you by our office regarding your preparation for the procedure.  We have sent the following medications to your pharmacy for you to pick up at your convenience: Suprep  Please purchase over the counter Benefiber and take as directed daily.  Be sure to increase your fluid intake.  Normal BMI (Body Mass Index- based on height and weight) is between 19 and 25. Your BMI today is Body mass index is 24.69 kg/m. Marland Kitchen Please consider follow up  regarding your BMI with your Primary Care Provider.

## 2017-07-24 NOTE — Progress Notes (Signed)
Assessment and plans for colonoscopy reviewed 

## 2017-07-24 NOTE — Progress Notes (Signed)
Subjective:    Patient ID: Jillian Rodriguez, female    DOB: Jun 08, 1961, 56 y.o.   MRN: [id number]  HPI Jillian Rodriguez is a pleasant 55 year old white female, known remotely to Dr. Henrene Pastor who comes in today with complaints of intermittent rectal bleeding. She is generally in good health with history of anxiety/depression, fibromyalgia. She had undergone colonoscopy for screening in 2008 with finding of left colon diverticulosis. No polyps. Patient says she has had long-term problems with mild constipation definite dating back many years but generally does not require any laxatives. She says she has noted a small amount of blood off and on on the tissue over the past few years but more recently she has had 2 occasions of seeing significantly larger amount of red blood actually in the commode after a bowel movement . She denies any abdominal pain or rectal pain. She has not had any changes in her bowel habits. She does have pellet-like stools most of the time. Family history is negative for colon cancer as far she is aware.  Review of Systems Pertinent positive and negative review of systems were noted in the above HPI section.  All other review of systems was otherwise negative.  Outpatient Encounter Prescriptions as of 07/24/2017  Medication Sig  . b complex vitamins tablet Take 1 tablet by mouth daily.  Marland Kitchen CALCIUM-MAGNESIUM-ZINC PO Take 1 tablet by mouth daily.  . Cholecalciferol (VITAMIN D3 PO) Take by mouth. 3000 daily  . DULoxetine (CYMBALTA) 60 MG capsule Take 60 mg by mouth daily.  Marland Kitchen estradiol (MINIVELLE) 0.1 MG/24HR patch Place 1 patch onto the skin 2 (two) times a week.  . MULTIPLE VITAMIN PO Take 1 tablet by mouth daily.  . Omega-3 Fatty Acids (FISH OIL PO) Take 1 capsule by mouth daily.  Marland Kitchen rOPINIRole (REQUIP) 0.25 MG tablet Take 0.25 mg by mouth 3 (three) times daily.  Manus Gunning BOWEL PREP KIT 17.5-3.13-1.6 GM/180ML SOLN Suprep-Use as directed  . Wheat Dextrin (BENEFIBER) POWD Take daily as  directed  . [DISCONTINUED] estradiol (VIVELLE-DOT) 0.1 MG/24HR patch Place 1 patch onto the skin 2 (two) times a week.  . [DISCONTINUED] meloxicam (MOBIC) 15 MG tablet Take 1 tablet (15 mg total) by mouth daily.  . [DISCONTINUED] SUPREP BOWEL PREP KIT 17.5-3.13-1.6 GM/180ML SOLN Suprep-Use as directed   No facility-administered encounter medications on file as of 07/24/2017.    Allergies  Allergen Reactions  . Penicillins Other (See Comments)    Unsure of reaction     Patient Active Problem List   Diagnosis Date Noted  . Diverticulosis of colon without hemorrhage 07/24/2017  . ANXIETY 04/06/2008  . DEPRESSION 04/06/2008  . IBS 04/06/2008  . FIBROMYALGIA 04/06/2008   Social History   Social History  . Marital status: Married    Spouse name: N/A  . Number of children: 1  . Years of education: N/A   Occupational History  . loan processor    Social History Main Topics  . Smoking status: Never Smoker  . Smokeless tobacco: Never Used  . Alcohol use No  . Drug use: No  . Sexual activity: Not on file   Other Topics Concern  . Not on file   Social History Narrative  . No narrative on file    Jillian Rodriguez's family history includes Breast cancer in her paternal grandmother; Colon polyps in her father; Heart disease in her father, maternal grandfather, and paternal uncle; Hypertension in her father; Kidney disease in her father.  Objective:    Vitals:   07/24/17 0825  BP: 120/72  Pulse: 68    Physical Exam  well-developed white female in no acute distress, quite pleasant blood pressure 120/72 pulse 68, height 5 foot 7 weight 160, BMI 24.6. HEENT; nontraumatic, cephalic EOMI PERRLA sclera anicteric, Cardiovascular; regular rate and rhythm with S1-S2 no murmur or gallop, Pulmonary ;clear bilaterally, Abdomen; soft, nontender no palpable mass or hepatosplenomegaly bowel sounds are present, Rectal ;exam she does have small noninflamed external hemorrhoids and one mildly  inflamed external hemorrhoid, on digital exam stool is heme-negative and has one palpable internal hemorrhoid no lesion felt, Extremities; no clubbing cyanosis or edema skin warm and dry, Neuropsych ;mood and affect appropriate       Assessment & Plan:   #71 57 year old white female with recent intermittent bright red blood per rectum with 2 episodes of significant blood in the commode. Last colonoscopy 2008 pertinent only for diverticulosis Bleeding may be secondary to internal hemorrhoids, rule out occult colon lesion #2 history of diverticulosis #3 small external hemorrhoids non-inflamed #4 fibromyalgia  Plan  ; start Benefiber 1 dose daily in a glass of juice or water Patient will be scheduled for colonoscopy with Dr. Henrene Pastor. Procedure discussed in detail with patient including risks and benefits and she is agreeable to proceed. Patient was offered a trial of Anusol HC suppositories-she declines at present, and says she's not worried if the bleeding is coming from hemorrhoids she just wants to make sure there is not any other issue.  Bacilio Abascal S Elmor Kost PA-C 07/24/2017   Cc: Shon Baton, MD

## 2017-07-30 DIAGNOSIS — L309 Dermatitis, unspecified: Secondary | ICD-10-CM | POA: Diagnosis not present

## 2017-08-13 ENCOUNTER — Encounter: Payer: Self-pay | Admitting: Internal Medicine

## 2017-08-23 ENCOUNTER — Telehealth: Payer: Self-pay

## 2017-08-23 NOTE — Telephone Encounter (Signed)
Left message for patient that per Amy Hazlewood, her colonoscopy would be considered diagnostic because of her diagnoses of rectal bleeding and constipation.  I told her to call with questions.

## 2017-08-23 NOTE — Telephone Encounter (Signed)
Patient returned phone call. Best # [phone]

## 2017-08-24 ENCOUNTER — Encounter: Payer: Self-pay | Admitting: Internal Medicine

## 2017-08-24 ENCOUNTER — Ambulatory Visit (AMBULATORY_SURGERY_CENTER): Payer: BLUE CROSS/BLUE SHIELD | Admitting: Internal Medicine

## 2017-08-24 VITALS — BP 121/73 | HR 63 | Temp 97.7°F | Resp 13 | Ht 67.0 in | Wt 160.0 lb

## 2017-08-24 DIAGNOSIS — K5730 Diverticulosis of large intestine without perforation or abscess without bleeding: Secondary | ICD-10-CM

## 2017-08-24 DIAGNOSIS — Z1211 Encounter for screening for malignant neoplasm of colon: Secondary | ICD-10-CM

## 2017-08-24 DIAGNOSIS — K51411 Inflammatory polyps of colon with rectal bleeding: Secondary | ICD-10-CM | POA: Diagnosis not present

## 2017-08-24 DIAGNOSIS — K635 Polyp of colon: Secondary | ICD-10-CM | POA: Diagnosis not present

## 2017-08-24 DIAGNOSIS — D123 Benign neoplasm of transverse colon: Secondary | ICD-10-CM

## 2017-08-24 DIAGNOSIS — K5900 Constipation, unspecified: Secondary | ICD-10-CM

## 2017-08-24 DIAGNOSIS — K625 Hemorrhage of anus and rectum: Secondary | ICD-10-CM

## 2017-08-24 MED ORDER — SODIUM CHLORIDE 0.9 % IV SOLN: 500.0000 mL | INTRAVENOUS | Status: DC

## 2017-08-24 NOTE — Progress Notes (Signed)
Called to room to assist during endoscopic procedure.  Patient ID and intended procedure confirmed with present staff. Received instructions for my participation in the procedure from the performing physician.  

## 2017-08-24 NOTE — Progress Notes (Signed)
A/ox3 pleased with MAC, report to Sheila RN 

## 2017-08-24 NOTE — Op Note (Signed)
[facility] Patient Name: Jillian Rodriguez Procedure Date: 08/24/2017 3:21 PM MRN: [id number] Endoscopist: Docia Chuck. Henrene Pastor , MD Age: 56 Referring MD:  Date of Birth: [birth year] Gender: Female Account #: [id number] Procedure:                Colonoscopy, with cold snare polypectomy x 1 Indications:              Screening for colorectal malignant neoplasm Medicines:                Monitored Anesthesia Care Procedure:                Pre-Anesthesia Assessment:                           - Prior to the procedure, a History and Physical                            was performed, and patient medications and                            allergies were reviewed. The patient's tolerance of                            previous anesthesia was also reviewed. The risks                            and benefits of the procedure and the sedation                            options and risks were discussed with the patient.                            All questions were answered, and informed consent                            was obtained. Prior Anticoagulants: The patient has                            taken no previous anticoagulant or antiplatelet                            agents. ASA Grade Assessment: II - A patient with                            mild systemic disease. After reviewing the risks                            and benefits, the patient was deemed in                            satisfactory condition to undergo the procedure.                           After obtaining informed consent, the colonoscope  was passed under direct vision. Throughout the                            procedure, the patient's blood pressure, pulse, and                            oxygen saturations were monitored continuously. The                            Colonoscope was introduced through the anus and                            advanced to the the cecum, identified by       appendiceal orifice and ileocecal valve. The                            ileocecal valve, appendiceal orifice, and rectum                            were photographed. The quality of the bowel                            preparation was excellent. The colonoscopy was                            performed without difficulty. The patient tolerated                            the procedure well. The bowel preparation used was                            SUPREP. Scope In: 3:26:54 PM Scope Out: 3:41:42 PM Scope Withdrawal Time: 0 hours 11 minutes 56 seconds  Total Procedure Duration: 0 hours 14 minutes 48 seconds  Findings:                 A 2 mm polyp was found in the hepatic flexure. The                            polyp was removed with a cold snare. Resection and                            retrieval were complete.                           Multiple small and large-mouthed diverticula were                            found in the sigmoid colon.                           Internal hemorrhoids were found during retroflexion.                           The exam was otherwise without abnormality  on                            direct and retroflexion views. Complications:            No immediate complications. Estimated blood loss:                            None. Estimated Blood Loss:     Estimated blood loss: none. Impression:               - One 2 mm polyp at the hepatic flexure, removed                            with a cold snare. Resected and retrieved.                           - Diverticulosis in the sigmoid colon.                           - Internal hemorrhoids.                           - The examination was otherwise normal on direct                            and retroflexion views. Recommendation:           - Repeat colonoscopy in 5-10 years for surveillance.                           - Patient has a contact number available for                            emergencies. The signs and  symptoms of potential                            delayed complications were discussed with the                            patient. Return to normal activities tomorrow.                            Written discharge instructions were provided to the                            patient.                           - Resume previous diet.                           - Continue present medications.                           - Await pathology results. Docia Chuck. Henrene Pastor, MD 08/24/2017 3:45:57 PM This report has been signed electronically.

## 2017-08-24 NOTE — Patient Instructions (Signed)
YOU HAD AN ENDOSCOPIC PROCEDURE TODAY AT [facility]:   Refer to the procedure report that was given to you for any specific questions about what was found during the examination.  If the procedure report does not answer your questions, please call your gastroenterologist to clarify.  If you requested that your care partner not be given the details of your procedure findings, then the procedure report has been included in a sealed envelope for you to review at your convenience later.  YOU SHOULD EXPECT: Some feelings of bloating in the abdomen. Passage of more gas than usual.  Walking can help get rid of the air that was put into your GI tract during the procedure and reduce the bloating. If you had a lower endoscopy (such as a colonoscopy or flexible sigmoidoscopy) you may notice spotting of blood in your stool or on the toilet paper. If you underwent a bowel prep for your procedure, you may not have a normal bowel movement for a few days.  Please Note:  You might notice some irritation and congestion in your nose or some drainage.  This is from the oxygen used during your procedure.  There is no need for concern and it should clear up in a day or so.  SYMPTOMS TO REPORT IMMEDIATELY:   Following lower endoscopy (colonoscopy or flexible sigmoidoscopy):  Excessive amounts of blood in the stool  Significant tenderness or worsening of abdominal pains  Swelling of the abdomen that is new, acute  Fever of 100F or higher    For urgent or emergent issues, a gastroenterologist can be reached at any hour by calling [phone].   DIET:  We do recommend a small meal at first, but then you may proceed to your regular diet.  Drink plenty of fluids but you should avoid alcoholic beverages for 24 hours.  ACTIVITY:  You should plan to take it easy for the rest of today and you should NOT DRIVE or use heavy machinery until tomorrow (because of the sedation medicines used during the test).     FOLLOW UP: Our staff will call the number listed on your records the next business day following your procedure to check on you and address any questions or concerns that you may have regarding the information given to you following your procedure. If we do not reach you, we will leave a message.  However, if you are feeling well and you are not experiencing any problems, there is no need to return our call.  We will assume that you have returned to your regular daily activities without incident.  If any biopsies were taken you will be contacted by phone or by letter within the next 1-3 weeks.  Please call us at [phone] if you have not heard about the biopsies in 3 weeks.    SIGNATURES/CONFIDENTIALITY: You and/or your care partner have signed paperwork which will be entered into your electronic medical record.  These signatures attest to the fact that that the information above on your After Visit Summary has been reviewed and is understood.  Full responsibility of the confidentiality of this discharge information lies with you and/or your care-partner.  Resume medications. Information given on polyps,diverticulosis and hemorrhoids.

## 2017-08-24 NOTE — Progress Notes (Signed)
Pt's states no medical or surgical changes since previsit or office visit. 

## 2017-08-28 ENCOUNTER — Telehealth: Payer: Self-pay

## 2017-08-28 NOTE — Telephone Encounter (Signed)
  Follow up Call-  Call back number 08/24/2017  Post procedure Call Back phone  # [phone]  Permission to leave phone message Yes  Some recent data might be hidden     Patient questions:  Do you have a fever, pain , or abdominal swelling? No. Pain Score  0 *  Have you tolerated food without any problems? Yes.    Have you been able to return to your normal activities? Yes.    Do you have any questions about your discharge instructions: Diet   No. Medications  No. Follow up visit  No.  Do you have questions or concerns about your Care? No.  Actions: * If pain score is 4 or above: No action needed, pain <4.

## 2017-09-05 ENCOUNTER — Encounter: Payer: Self-pay | Admitting: Internal Medicine

## 2017-10-05 DIAGNOSIS — Z23 Encounter for immunization: Secondary | ICD-10-CM | POA: Diagnosis not present

## 2017-10-22 DIAGNOSIS — Z6824 Body mass index (BMI) 24.0-24.9, adult: Secondary | ICD-10-CM | POA: Diagnosis not present

## 2017-10-22 DIAGNOSIS — R413 Other amnesia: Secondary | ICD-10-CM | POA: Diagnosis not present

## 2017-10-22 DIAGNOSIS — R030 Elevated blood-pressure reading, without diagnosis of hypertension: Secondary | ICD-10-CM | POA: Diagnosis not present

## 2017-10-22 DIAGNOSIS — F418 Other specified anxiety disorders: Secondary | ICD-10-CM | POA: Diagnosis not present

## 2017-11-01 DIAGNOSIS — H748X1 Other specified disorders of right middle ear and mastoid: Secondary | ICD-10-CM | POA: Diagnosis not present

## 2017-11-01 DIAGNOSIS — Z6824 Body mass index (BMI) 24.0-24.9, adult: Secondary | ICD-10-CM | POA: Diagnosis not present

## 2017-11-01 DIAGNOSIS — J069 Acute upper respiratory infection, unspecified: Secondary | ICD-10-CM | POA: Diagnosis not present

## 2017-11-01 DIAGNOSIS — F418 Other specified anxiety disorders: Secondary | ICD-10-CM | POA: Diagnosis not present

## 2018-01-25 ENCOUNTER — Encounter (HOSPITAL_COMMUNITY): Payer: Self-pay

## 2018-01-25 ENCOUNTER — Emergency Department (HOSPITAL_COMMUNITY)
Admission: EM | Admit: 2018-01-25 | Discharge: 2018-01-25 | Disposition: A | Discharge status: Home / Self Care | Payer: BLUE CROSS/BLUE SHIELD | Attending: Emergency Medicine | Admitting: Emergency Medicine

## 2018-01-25 ENCOUNTER — Other Ambulatory Visit: Payer: Self-pay

## 2018-01-25 ENCOUNTER — Emergency Department (HOSPITAL_COMMUNITY): Payer: BLUE CROSS/BLUE SHIELD

## 2018-01-25 DIAGNOSIS — F419 Anxiety disorder, unspecified: Secondary | ICD-10-CM | POA: Diagnosis not present

## 2018-01-25 DIAGNOSIS — Z79899 Other long term (current) drug therapy: Secondary | ICD-10-CM | POA: Diagnosis not present

## 2018-01-25 DIAGNOSIS — Z0000 Encounter for general adult medical examination without abnormal findings: Secondary | ICD-10-CM | POA: Diagnosis not present

## 2018-01-25 DIAGNOSIS — F418 Other specified anxiety disorders: Secondary | ICD-10-CM | POA: Diagnosis not present

## 2018-01-25 DIAGNOSIS — M545 Low back pain: Secondary | ICD-10-CM | POA: Diagnosis not present

## 2018-01-25 DIAGNOSIS — R946 Abnormal results of thyroid function studies: Secondary | ICD-10-CM | POA: Diagnosis not present

## 2018-01-25 DIAGNOSIS — S5001XA Contusion of right elbow, initial encounter: Secondary | ICD-10-CM | POA: Diagnosis not present

## 2018-01-25 DIAGNOSIS — Y999 Unspecified external cause status: Secondary | ICD-10-CM | POA: Insufficient documentation

## 2018-01-25 DIAGNOSIS — S59901A Unspecified injury of right elbow, initial encounter: Secondary | ICD-10-CM | POA: Diagnosis not present

## 2018-01-25 DIAGNOSIS — R51 Headache: Secondary | ICD-10-CM | POA: Insufficient documentation

## 2018-01-25 DIAGNOSIS — M25512 Pain in left shoulder: Secondary | ICD-10-CM | POA: Insufficient documentation

## 2018-01-25 DIAGNOSIS — R82998 Other abnormal findings in urine: Secondary | ICD-10-CM | POA: Diagnosis not present

## 2018-01-25 DIAGNOSIS — Y92410 Unspecified street and highway as the place of occurrence of the external cause: Secondary | ICD-10-CM | POA: Insufficient documentation

## 2018-01-25 DIAGNOSIS — Y939 Activity, unspecified: Secondary | ICD-10-CM | POA: Insufficient documentation

## 2018-01-25 DIAGNOSIS — S29012A Strain of muscle and tendon of back wall of thorax, initial encounter: Secondary | ICD-10-CM | POA: Diagnosis not present

## 2018-01-25 DIAGNOSIS — M859 Disorder of bone density and structure, unspecified: Secondary | ICD-10-CM | POA: Diagnosis not present

## 2018-01-25 DIAGNOSIS — T148XXA Other injury of unspecified body region, initial encounter: Secondary | ICD-10-CM

## 2018-01-25 DIAGNOSIS — M25521 Pain in right elbow: Secondary | ICD-10-CM | POA: Diagnosis not present

## 2018-01-25 MED ORDER — CYCLOBENZAPRINE HCL 10 MG PO TABS
10.0000 mg | ORAL_TABLET | Freq: Once | ORAL | Status: AC
  Administered 2018-01-25: 10 mg via ORAL
  Filled 2018-01-25: qty 1

## 2018-01-25 MED ORDER — CYCLOBENZAPRINE HCL 10 MG PO TABS: 10.0000 mg | ORAL_TABLET | Freq: Two times a day (BID) | ORAL | 0 refills | Status: DC | PRN

## 2018-01-25 MED ORDER — TRAMADOL HCL 50 MG PO TABS
50.0000 mg | ORAL_TABLET | Freq: Once | ORAL | Status: AC
  Administered 2018-01-25: 50 mg via ORAL
  Filled 2018-01-25: qty 1

## 2018-01-25 NOTE — ED Notes (Signed)
PT DISCHARGED. INSTRUCTIONS AND PRESCRIPTION GIVEN. AAOX4. PT IN NO APPARENT DISTRESS OR PAIN. THE OPPORTUNITY TO ASK QUESTIONS WAS PROVIDED. 
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
## 2018-01-25 NOTE — Discharge Instructions (Signed)
Do not take the muscle relaxer if you are driving as it will make you sleepy. Follow up with your doctor in one week for recheck. Return here sooner for any problems.

## 2018-02-01 DIAGNOSIS — R030 Elevated blood-pressure reading, without diagnosis of hypertension: Secondary | ICD-10-CM | POA: Diagnosis not present

## 2018-02-01 DIAGNOSIS — R0683 Snoring: Secondary | ICD-10-CM | POA: Diagnosis not present

## 2018-02-01 DIAGNOSIS — Z1389 Encounter for screening for other disorder: Secondary | ICD-10-CM | POA: Diagnosis not present

## 2018-02-01 DIAGNOSIS — G2581 Restless legs syndrome: Secondary | ICD-10-CM | POA: Diagnosis not present

## 2018-02-01 DIAGNOSIS — N3281 Overactive bladder: Secondary | ICD-10-CM | POA: Diagnosis not present

## 2018-02-01 DIAGNOSIS — Z0000 Encounter for general adult medical examination without abnormal findings: Secondary | ICD-10-CM | POA: Diagnosis not present

## 2018-03-11 DIAGNOSIS — M791 Myalgia, unspecified site: Secondary | ICD-10-CM | POA: Diagnosis not present

## 2018-07-10 DIAGNOSIS — Z0000 Encounter for general adult medical examination without abnormal findings: Secondary | ICD-10-CM | POA: Diagnosis not present

## 2018-08-07 DIAGNOSIS — Z01419 Encounter for gynecological examination (general) (routine) without abnormal findings: Secondary | ICD-10-CM | POA: Diagnosis not present

## 2018-08-07 DIAGNOSIS — Z6824 Body mass index (BMI) 24.0-24.9, adult: Secondary | ICD-10-CM | POA: Diagnosis not present

## 2018-08-07 DIAGNOSIS — Z808 Family history of malignant neoplasm of other organs or systems: Secondary | ICD-10-CM | POA: Diagnosis not present

## 2018-08-07 DIAGNOSIS — Z13820 Encounter for screening for osteoporosis: Secondary | ICD-10-CM | POA: Diagnosis not present

## 2018-08-07 DIAGNOSIS — Z801 Family history of malignant neoplasm of trachea, bronchus and lung: Secondary | ICD-10-CM | POA: Diagnosis not present

## 2018-08-07 DIAGNOSIS — Z803 Family history of malignant neoplasm of breast: Secondary | ICD-10-CM | POA: Diagnosis not present

## 2018-10-01 DIAGNOSIS — D229 Melanocytic nevi, unspecified: Secondary | ICD-10-CM | POA: Diagnosis not present

## 2018-10-01 DIAGNOSIS — L304 Erythema intertrigo: Secondary | ICD-10-CM | POA: Diagnosis not present

## 2018-10-02 DIAGNOSIS — Z23 Encounter for immunization: Secondary | ICD-10-CM | POA: Diagnosis not present

## 2018-11-05 DIAGNOSIS — H02831 Dermatochalasis of right upper eyelid: Secondary | ICD-10-CM | POA: Diagnosis not present

## 2018-11-05 DIAGNOSIS — H2513 Age-related nuclear cataract, bilateral: Secondary | ICD-10-CM | POA: Diagnosis not present

## 2018-11-05 DIAGNOSIS — H25013 Cortical age-related cataract, bilateral: Secondary | ICD-10-CM | POA: Diagnosis not present

## 2018-11-05 DIAGNOSIS — H2511 Age-related nuclear cataract, right eye: Secondary | ICD-10-CM | POA: Diagnosis not present

## 2018-11-05 DIAGNOSIS — H18413 Arcus senilis, bilateral: Secondary | ICD-10-CM | POA: Diagnosis not present

## 2018-12-04 DIAGNOSIS — Z961 Presence of intraocular lens: Secondary | ICD-10-CM | POA: Diagnosis not present

## 2018-12-04 DIAGNOSIS — H2511 Age-related nuclear cataract, right eye: Secondary | ICD-10-CM | POA: Diagnosis not present

## 2018-12-05 DIAGNOSIS — H2512 Age-related nuclear cataract, left eye: Secondary | ICD-10-CM | POA: Diagnosis not present

## 2018-12-05 DIAGNOSIS — H25042 Posterior subcapsular polar age-related cataract, left eye: Secondary | ICD-10-CM | POA: Diagnosis not present

## 2018-12-05 DIAGNOSIS — H25012 Cortical age-related cataract, left eye: Secondary | ICD-10-CM | POA: Diagnosis not present

## 2018-12-23 DIAGNOSIS — Z961 Presence of intraocular lens: Secondary | ICD-10-CM | POA: Diagnosis not present

## 2018-12-23 DIAGNOSIS — H2512 Age-related nuclear cataract, left eye: Secondary | ICD-10-CM | POA: Diagnosis not present

## 2019-01-14 DIAGNOSIS — J040 Acute laryngitis: Secondary | ICD-10-CM | POA: Diagnosis not present

## 2019-02-04 DIAGNOSIS — R946 Abnormal results of thyroid function studies: Secondary | ICD-10-CM | POA: Diagnosis not present

## 2019-02-04 DIAGNOSIS — M859 Disorder of bone density and structure, unspecified: Secondary | ICD-10-CM | POA: Diagnosis not present

## 2019-02-04 DIAGNOSIS — R82998 Other abnormal findings in urine: Secondary | ICD-10-CM | POA: Diagnosis not present

## 2019-02-04 DIAGNOSIS — Z0000 Encounter for general adult medical examination without abnormal findings: Secondary | ICD-10-CM | POA: Diagnosis not present

## 2019-02-11 DIAGNOSIS — F329 Major depressive disorder, single episode, unspecified: Secondary | ICD-10-CM | POA: Diagnosis not present

## 2019-02-11 DIAGNOSIS — Z1331 Encounter for screening for depression: Secondary | ICD-10-CM | POA: Diagnosis not present

## 2019-02-11 DIAGNOSIS — Z0000 Encounter for general adult medical examination without abnormal findings: Secondary | ICD-10-CM | POA: Diagnosis not present

## 2019-02-11 DIAGNOSIS — R0683 Snoring: Secondary | ICD-10-CM | POA: Diagnosis not present

## 2019-02-11 DIAGNOSIS — N3281 Overactive bladder: Secondary | ICD-10-CM | POA: Diagnosis not present

## 2019-02-11 DIAGNOSIS — G2581 Restless legs syndrome: Secondary | ICD-10-CM | POA: Diagnosis not present

## 2019-02-18 DIAGNOSIS — L723 Sebaceous cyst: Secondary | ICD-10-CM | POA: Diagnosis not present

## 2019-02-18 DIAGNOSIS — L900 Lichen sclerosus et atrophicus: Secondary | ICD-10-CM | POA: Diagnosis not present

## 2019-09-11 DIAGNOSIS — I8311 Varicose veins of right lower extremity with inflammation: Secondary | ICD-10-CM | POA: Diagnosis not present

## 2019-09-11 DIAGNOSIS — I83813 Varicose veins of bilateral lower extremities with pain: Secondary | ICD-10-CM | POA: Diagnosis not present

## 2019-09-11 DIAGNOSIS — I8312 Varicose veins of left lower extremity with inflammation: Secondary | ICD-10-CM | POA: Diagnosis not present

## 2019-09-18 DIAGNOSIS — I8312 Varicose veins of left lower extremity with inflammation: Secondary | ICD-10-CM | POA: Diagnosis not present

## 2019-09-18 DIAGNOSIS — I8311 Varicose veins of right lower extremity with inflammation: Secondary | ICD-10-CM | POA: Diagnosis not present

## 2019-09-18 DIAGNOSIS — I83813 Varicose veins of bilateral lower extremities with pain: Secondary | ICD-10-CM | POA: Diagnosis not present

## 2019-09-19 DIAGNOSIS — R51 Headache: Secondary | ICD-10-CM | POA: Diagnosis not present

## 2019-09-19 DIAGNOSIS — R55 Syncope and collapse: Secondary | ICD-10-CM | POA: Diagnosis not present

## 2019-09-19 DIAGNOSIS — R030 Elevated blood-pressure reading, without diagnosis of hypertension: Secondary | ICD-10-CM | POA: Diagnosis not present

## 2019-09-19 DIAGNOSIS — R42 Dizziness and giddiness: Secondary | ICD-10-CM | POA: Diagnosis not present

## 2019-09-19 DIAGNOSIS — Z23 Encounter for immunization: Secondary | ICD-10-CM | POA: Diagnosis not present

## 2019-10-03 DIAGNOSIS — I10 Essential (primary) hypertension: Secondary | ICD-10-CM | POA: Diagnosis not present

## 2019-10-03 DIAGNOSIS — R55 Syncope and collapse: Secondary | ICD-10-CM | POA: Diagnosis not present

## 2019-10-03 DIAGNOSIS — G2581 Restless legs syndrome: Secondary | ICD-10-CM | POA: Diagnosis not present

## 2019-10-09 ENCOUNTER — Emergency Department (HOSPITAL_COMMUNITY)
Admission: EM | Admit: 2019-10-09 | Discharge: 2019-10-09 | Disposition: A | Discharge status: Home / Self Care | Payer: BC Managed Care – PPO | Attending: Emergency Medicine | Admitting: Emergency Medicine

## 2019-10-09 ENCOUNTER — Emergency Department (HOSPITAL_COMMUNITY): Payer: BC Managed Care – PPO

## 2019-10-09 ENCOUNTER — Other Ambulatory Visit: Payer: Self-pay

## 2019-10-09 ENCOUNTER — Encounter (HOSPITAL_COMMUNITY): Payer: Self-pay | Admitting: Emergency Medicine

## 2019-10-09 DIAGNOSIS — R42 Dizziness and giddiness: Secondary | ICD-10-CM | POA: Insufficient documentation

## 2019-10-09 DIAGNOSIS — R519 Headache, unspecified: Secondary | ICD-10-CM | POA: Diagnosis not present

## 2019-10-09 DIAGNOSIS — R946 Abnormal results of thyroid function studies: Secondary | ICD-10-CM | POA: Diagnosis not present

## 2019-10-09 DIAGNOSIS — Z79899 Other long term (current) drug therapy: Secondary | ICD-10-CM | POA: Diagnosis not present

## 2019-10-09 DIAGNOSIS — R410 Disorientation, unspecified: Secondary | ICD-10-CM | POA: Diagnosis not present

## 2019-10-09 DIAGNOSIS — R7989 Other specified abnormal findings of blood chemistry: Secondary | ICD-10-CM | POA: Diagnosis not present

## 2019-10-09 LAB — CBC
HCT: 41.9 % (ref 36.0–46.0)
Hemoglobin: 14.1 g/dL (ref 12.0–15.0)
MCH: 32.6 pg (ref 26.0–34.0)
MCHC: 33.7 g/dL (ref 30.0–36.0)
MCV: 96.8 fL (ref 80.0–100.0)
Platelets: 375 10*3/uL (ref 150–400)
RBC: 4.33 MIL/uL (ref 3.87–5.11)
RDW: 11.9 % (ref 11.5–15.5)
WBC: 7.7 10*3/uL (ref 4.0–10.5)
nRBC: 0 % (ref 0.0–0.2)

## 2019-10-09 LAB — URINALYSIS, ROUTINE W REFLEX MICROSCOPIC
Bacteria, UA: NONE SEEN
Bilirubin Urine: NEGATIVE
Glucose, UA: NEGATIVE mg/dL
Ketones, ur: NEGATIVE mg/dL
Leukocytes,Ua: NEGATIVE
Nitrite: NEGATIVE
Protein, ur: NEGATIVE mg/dL
Specific Gravity, Urine: 1.011 (ref 1.005–1.030)
pH: 6 (ref 5.0–8.0)

## 2019-10-09 LAB — PHOSPHORUS: Phosphorus: 5.1 mg/dL — ABNORMAL HIGH (ref 2.5–4.6)

## 2019-10-09 LAB — BASIC METABOLIC PANEL
Anion gap: 10 (ref 5–15)
BUN: 10 mg/dL (ref 6–20)
CO2: 28 mmol/L (ref 22–32)
Calcium: 9.9 mg/dL (ref 8.9–10.3)
Chloride: 102 mmol/L (ref 98–111)
Creatinine, Ser: 0.97 mg/dL (ref 0.44–1.00)
GFR calc Af Amer: >60 mL/min (ref >60)
GFR calc non Af Amer: >60 mL/min (ref >60)
Glucose, Bld: 90 mg/dL (ref 70–99)
Potassium: 3.8 mmol/L (ref 3.5–5.1)
Sodium: 140 mmol/L (ref 135–145)

## 2019-10-09 LAB — T4, FREE: Free T4: 0.96 ng/dL (ref 0.61–1.12)

## 2019-10-09 LAB — CBG MONITORING, ED: Glucose-Capillary: 93 mg/dL (ref 70–99)

## 2019-10-09 LAB — TSH: TSH: 15.157 u[IU]/mL — ABNORMAL HIGH (ref 0.350–4.500)

## 2019-10-09 LAB — MAGNESIUM: Magnesium: 2.2 mg/dL (ref 1.7–2.4)

## 2019-10-09 LAB — TROPONIN I (HIGH SENSITIVITY): Troponin I (High Sensitivity): 13 ng/L (ref <18)

## 2019-10-09 MED ORDER — MECLIZINE HCL 25 MG PO TABS
25.0000 mg | ORAL_TABLET | Freq: Once | ORAL | Status: AC
  Administered 2019-10-09: 25 mg via ORAL
  Filled 2019-10-09: qty 1

## 2019-10-09 MED ORDER — SODIUM CHLORIDE 0.9% FLUSH: 3.0000 mL | Freq: Once | INTRAVENOUS | Status: DC

## 2019-10-09 MED ORDER — SODIUM CHLORIDE 0.9 % IV BOLUS
500.0000 mL | Freq: Once | INTRAVENOUS | Status: AC
  Administered 2019-10-09: 500 mL via INTRAVENOUS

## 2019-10-09 NOTE — ED Notes (Signed)
Lab to add T4.

## 2019-10-09 NOTE — ED Triage Notes (Signed)
Pt arrives to ED for c/o of dizziness today started at: 10am. Pt states the last Thursday of sept she had a similar episode and actually passed out that time.  Pt was seen at PCP after the syncopal episode and states she was started on hypertension medications. Pt has clear speech no weakness, van negative.

## 2019-10-09 NOTE — Discharge Instructions (Signed)
Your TSH was elevated today (TSH of 15), indicating that your thyroid might be underactive.  Follow up with your primary care doctor for further evaluation of your thyroid and symptoms.  Return to the ER if you develop any new, worsening, or concerning symptoms.

## 2019-10-09 NOTE — ED Notes (Signed)
ED Provider at bedside. 

## 2019-10-09 NOTE — ED Provider Notes (Signed)
[facility] EMERGENCY DEPARTMENT Provider Note   CSN: [id number] Arrival date & time: 10/09/19  1500     History   Chief Complaint Chief Complaint  Patient presents with  . Dizziness    HPI Jillian Rodriguez is a 58 y.o. female presenting for evaluation of dizziness.   Patient states 3 weeks ago she had sudden onset dizziness while at home.  Patient states she felt did not pass out, and everything went black and she was sitting down.  She did not follow her chair.  Symptoms resolved immediately without intervention.  She saw her primary care doctor who started her on blood pressure medication, and drew blood work which was reassuring (unknwon what blood work).  Patient states since that episode, she has been having intermittent episodes of dizziness.  She describes it as feeling as if things are moving, but she is not expressing anything new.  She denies lightheadedness.  Symptoms are worse when she moves her head.  Patient states occasionally she has associated feeling of palpitations or chest heaviness.  She reports intermittent headaches which are never in the same location.  They are sharp last for several seconds before resolving without intervention.  She denies fall, trauma, injury.  No fevers, chills, slurred speech, numbness, tingling.  She denies cough, shortness of breath, nausea, vomiting, abdominal pain, urinary symptoms, abnormal bowel movements.  Patient does report intermittent cramping, which has worsened recently.  Usually it is in her lower legs.  Additional history obtained from chart review.  Patient with a history of anxiety, depression, fibromyalgia, IBS, restless leg.     HPI  Past Medical History:  Diagnosis Date  . Allergy   . Anxiety   . Depression   . Fibromyalgia   . Gallstones   . IBS (irritable bowel syndrome)     Patient Active Problem List   Diagnosis Date Noted  . Diverticulosis of colon without hemorrhage 07/24/2017  . ANXIETY  04/06/2008  . DEPRESSION 04/06/2008  . IBS 04/06/2008  . FIBROMYALGIA 04/06/2008    Past Surgical History:  Procedure Laterality Date  . CESAREAN SECTION     x 1  . CHOLECYSTECTOMY    . LAPAROSCOPY     endometriosis and uterine polyp removal  . OVARIAN CYST REMOVAL    . VAGINAL HYSTERECTOMY  04/2007   total     OB History   No obstetric history on file.      Home Medications    Prior to Admission medications   Medication Sig Start Date End Date Taking? Authorizing Provider  b complex vitamins tablet Take 1 tablet by mouth daily.   Yes [provider]  CALCIUM-MAGNESIUM-ZINC PO Take 1 tablet by mouth daily.   Yes [provider]  Cholecalciferol (VITAMIN D3 PO) Take by mouth. 3000 daily   Yes [provider]  DULoxetine (CYMBALTA) 60 MG capsule Take 60 mg by mouth daily.   Yes [provider]  estradiol (MINIVELLE) 0.1 MG/24HR patch Place 1 patch onto the skin 2 (two) times a week.   Yes [provider]  fluticasone (FLONASE) 50 MCG/ACT nasal spray Place 1 spray into both nostrils daily.   Yes [provider]  loratadine (CLARITIN) 10 MG tablet Take 10 mg by mouth daily.   Yes [provider]  Multiple Vitamin (MULTIVITAMIN WITH MINERALS) TABS tablet Take 1 tablet by mouth daily.   Yes [provider]  olmesartan (BENICAR) 20 MG tablet Take 20 mg by mouth daily.  Yes [provider]  Omega-3 Fatty Acids (FISH OIL PO) Take 1 capsule by mouth daily.   Yes [provider]  rOPINIRole (REQUIP) 0.25 MG tablet Take 0.25 mg by mouth 3 (three) times daily.   Yes [provider]    Family History Family History  Problem Relation Age of Onset  . Heart disease Father   . Hypertension Father   . Colon polyps Father   . Kidney disease Father        stage 3  . Heart disease Maternal Grandfather   . Breast cancer Paternal Grandmother   . Heart disease Paternal Uncle     Social  History Social History   Tobacco Use  . Smoking status: Never Smoker  . Smokeless tobacco: Never Used  Substance Use Topics  . Alcohol use: No    Alcohol/week: 0.0 standard drinks  . Drug use: No     Allergies   Penicillins   Review of Systems Review of Systems  Cardiovascular: Positive for palpitations (intermittent).  Neurological: Positive for dizziness and headaches.  All other systems reviewed and are negative.    Physical Exam Updated Vital Signs BP 106/68   Pulse 73   Temp 98.2 F (36.8 C) (Oral)   Resp (!) 21   SpO2 95%   Physical Exam Vitals signs and nursing note reviewed.  Constitutional:      General: She is not in acute distress.    Appearance: She is well-developed.     Comments: Resting comfortable in the bed in NAD  HENT:     Head: Normocephalic and atraumatic.  Eyes:     Extraocular Movements: Extraocular movements intact.     Conjunctiva/sclera: Conjunctivae normal.     Pupils: Pupils are equal, round, and reactive to light.     Comments: EOMI and PERRLA. No nystagmus  Neck:     Musculoskeletal: Normal range of motion and neck supple.  Cardiovascular:     Rate and Rhythm: Normal rate and regular rhythm.     Pulses: Normal pulses.  Pulmonary:     Effort: Pulmonary effort is normal. No respiratory distress.     Breath sounds: Normal breath sounds. No wheezing.  Abdominal:     General: There is no distension.     Palpations: Abdomen is soft. There is no mass.     Tenderness: There is no abdominal tenderness. There is no guarding or rebound.  Musculoskeletal: Normal range of motion.  Skin:    General: Skin is warm and dry.     Capillary Refill: Capillary refill takes less than 2 seconds.  Neurological:     General: No focal deficit present.     Mental Status: She is alert and oriented to person, place, and time.     GCS: GCS eye subscore is 4. GCS verbal subscore is 5. GCS motor subscore is 6.     Cranial Nerves: Cranial nerves are  intact.     Sensory: Sensation is intact.     Motor: Motor function is intact. No pronator drift.     Coordination: Coordination is intact. Coordination normal. Finger-Nose-Finger Test and Heel to [facility] Test normal.     Comments: No obvious neuro deficits. CN intact. Nose to finger intact. Fine movement and coordination intact.       ED Treatments / Results  Labs (all labs ordered are listed, but only abnormal results are displayed) Labs Reviewed  URINALYSIS, ROUTINE W REFLEX MICROSCOPIC - Abnormal; Notable for the following components:  Result Value   APPearance HAZY (*)    Hgb urine dipstick SMALL (*)    All other components within normal limits  TSH - Abnormal; Notable for the following components:   TSH 15.157 (*)    All other components within normal limits  PHOSPHORUS - Abnormal; Notable for the following components:   Phosphorus 5.1 (*)    All other components within normal limits  BASIC METABOLIC PANEL  CBC  MAGNESIUM  T4, FREE  CBG MONITORING, ED  TROPONIN I (HIGH SENSITIVITY)    EKG EKG Interpretation  Date/Time:  Thursday October 09 2019 21:18:43 EDT Ventricular Rate:  74 PR Interval:    QRS Duration: 101 QT Interval:  400 QTC Calculation: 444 R Axis:   71 Text Interpretation:  Sinus rhythm nonspecific ST changes No old tracing to compare Confirmed by Sherwood Gambler [phone]) on 10/09/2019 9:21:26 PM   Radiology Ct Head Wo Contrast  Result Date: 10/09/2019 CLINICAL DATA:  Vertigo confusion EXAM: CT HEAD WITHOUT CONTRAST TECHNIQUE: Contiguous axial images were obtained from the base of the skull through the vertex without intravenous contrast. COMPARISON:  None. FINDINGS: Brain: No acute territorial infarction, hemorrhage, or intracranial mass. The ventricles are nonenlarged. Vascular: No hyperdense vessel or unexpected calcification. Skull: Normal. Negative for fracture or focal lesion. Sinuses/Orbits: No acute finding. Other: None IMPRESSION: Negative  non contrasted CT of the brain Electronically Signed   By: Donavan Foil M.D.   On: 10/09/2019 20:03    Procedures Procedures (including critical care time)  Medications Ordered in ED Medications  sodium chloride flush (NS) 0.9 % injection 3 mL (3 mLs Intravenous Not Given 10/09/19 2010)  sodium chloride 0.9 % bolus 500 mL (0 mLs Intravenous Stopped 10/09/19 2204)  meclizine (ANTIVERT) tablet 25 mg (25 mg Oral Given 10/09/19 2006)     Initial Impression / Assessment and Plan / ED Course  I have reviewed the triage vital signs and the nursing notes.  Pertinent labs & imaging results that were available during my care of the patient were reviewed by me and considered in my medical decision making (see chart for details).        Patient presenting for evaluation of dizziness.  Physical exam shows patient appears nontoxic.  Symptoms have been present for several weeks, although worsened today.  No focal neurologic deficits, doubt CVA. However as sxs are worsening/continuing, will obtain labs and CT head.  EKG for further evaluation.  CT head negative for acute findings.  EKG shows sinus rhythm with nonspecific ST changes.  Labs reassuring.  No obvious electrolyte abnormalities.  TSH elevated at 15, as such we will add on free T4.  On reassessment, patient reports improvement of dizziness with meclizine and fluids.  As symptoms are worsened with movement of her head, I have a high suspicion for vertigo/peripheral dizziness.  Doubt central cause.  I do not believe she needs acute or emergent MRI.  She would likely need further follow-up with her primary care doctor for further evaluation, especially of her TSH.  Discussed findings and plan with patient, who is agreeable.  At this time, patient appears safe for discharge.  Return precautions given.  Patient states she understands and agrees to plan.  Final Clinical Impressions(s) / ED Diagnoses   Final diagnoses:  Dizziness  Elevated TSH     ED Discharge Orders    None       Franchot Heidelberg, PA-C 10/09/19 2351    Sherwood Gambler, MD 10/10/19 1127

## 2019-10-13 DIAGNOSIS — M797 Fibromyalgia: Secondary | ICD-10-CM | POA: Diagnosis not present

## 2019-10-13 DIAGNOSIS — R946 Abnormal results of thyroid function studies: Secondary | ICD-10-CM | POA: Diagnosis not present

## 2019-10-13 DIAGNOSIS — R42 Dizziness and giddiness: Secondary | ICD-10-CM | POA: Diagnosis not present

## 2019-10-13 DIAGNOSIS — I10 Essential (primary) hypertension: Secondary | ICD-10-CM | POA: Diagnosis not present

## 2019-11-07 DIAGNOSIS — H43392 Other vitreous opacities, left eye: Secondary | ICD-10-CM | POA: Diagnosis not present

## 2019-11-13 DIAGNOSIS — H43392 Other vitreous opacities, left eye: Secondary | ICD-10-CM | POA: Diagnosis not present

## 2019-11-13 DIAGNOSIS — H43812 Vitreous degeneration, left eye: Secondary | ICD-10-CM | POA: Diagnosis not present

## 2019-11-24 DIAGNOSIS — F4323 Adjustment disorder with mixed anxiety and depressed mood: Secondary | ICD-10-CM | POA: Diagnosis not present

## 2019-12-12 DIAGNOSIS — F4323 Adjustment disorder with mixed anxiety and depressed mood: Secondary | ICD-10-CM | POA: Diagnosis not present

## 2019-12-16 DIAGNOSIS — F4323 Adjustment disorder with mixed anxiety and depressed mood: Secondary | ICD-10-CM | POA: Diagnosis not present

## 2019-12-30 DIAGNOSIS — F4323 Adjustment disorder with mixed anxiety and depressed mood: Secondary | ICD-10-CM | POA: Diagnosis not present
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
## 2020-01-07 DIAGNOSIS — Z6822 Body mass index (BMI) 22.0-22.9, adult: Secondary | ICD-10-CM | POA: Diagnosis not present

## 2020-01-07 DIAGNOSIS — Z1231 Encounter for screening mammogram for malignant neoplasm of breast: Secondary | ICD-10-CM | POA: Diagnosis not present

## 2020-01-07 DIAGNOSIS — Z01419 Encounter for gynecological examination (general) (routine) without abnormal findings: Secondary | ICD-10-CM | POA: Diagnosis not present

## 2020-01-07 DIAGNOSIS — Z803 Family history of malignant neoplasm of breast: Secondary | ICD-10-CM | POA: Diagnosis not present

## 2020-01-13 DIAGNOSIS — F4323 Adjustment disorder with mixed anxiety and depressed mood: Secondary | ICD-10-CM | POA: Diagnosis not present

## 2020-01-19 DIAGNOSIS — Z03818 Encounter for observation for suspected exposure to other biological agents ruled out: Secondary | ICD-10-CM | POA: Diagnosis not present

## 2020-01-27 DIAGNOSIS — F4323 Adjustment disorder with mixed anxiety and depressed mood: Secondary | ICD-10-CM | POA: Diagnosis not present

## 2020-02-05 ENCOUNTER — Other Ambulatory Visit: Payer: Self-pay | Admitting: Obstetrics and Gynecology

## 2020-02-05 DIAGNOSIS — Z803 Family history of malignant neoplasm of breast: Secondary | ICD-10-CM

## 2020-02-11 DIAGNOSIS — R946 Abnormal results of thyroid function studies: Secondary | ICD-10-CM | POA: Diagnosis not present

## 2020-02-11 DIAGNOSIS — M859 Disorder of bone density and structure, unspecified: Secondary | ICD-10-CM | POA: Diagnosis not present

## 2020-02-11 DIAGNOSIS — I10 Essential (primary) hypertension: Secondary | ICD-10-CM | POA: Diagnosis not present

## 2020-02-11 DIAGNOSIS — Z0000 Encounter for general adult medical examination without abnormal findings: Secondary | ICD-10-CM | POA: Diagnosis not present

## 2020-02-11 DIAGNOSIS — Z79899 Other long term (current) drug therapy: Secondary | ICD-10-CM | POA: Diagnosis not present

## 2020-02-13 DIAGNOSIS — F4323 Adjustment disorder with mixed anxiety and depressed mood: Secondary | ICD-10-CM | POA: Diagnosis not present

## 2020-02-17 DIAGNOSIS — Z0000 Encounter for general adult medical examination without abnormal findings: Secondary | ICD-10-CM | POA: Diagnosis not present

## 2020-02-17 DIAGNOSIS — H04129 Dry eye syndrome of unspecified lacrimal gland: Secondary | ICD-10-CM | POA: Diagnosis not present

## 2020-02-17 DIAGNOSIS — R42 Dizziness and giddiness: Secondary | ICD-10-CM | POA: Diagnosis not present

## 2020-02-17 DIAGNOSIS — I10 Essential (primary) hypertension: Secondary | ICD-10-CM | POA: Diagnosis not present

## 2020-02-17 DIAGNOSIS — F329 Major depressive disorder, single episode, unspecified: Secondary | ICD-10-CM | POA: Diagnosis not present

## 2020-02-26 DIAGNOSIS — F4323 Adjustment disorder with mixed anxiety and depressed mood: Secondary | ICD-10-CM | POA: Diagnosis not present

## 2020-03-10 DIAGNOSIS — F4323 Adjustment disorder with mixed anxiety and depressed mood: Secondary | ICD-10-CM | POA: Diagnosis not present

## 2020-03-31 DIAGNOSIS — Z23 Encounter for immunization: Secondary | ICD-10-CM | POA: Diagnosis not present

## 2020-04-06 DIAGNOSIS — F4323 Adjustment disorder with mixed anxiety and depressed mood: Secondary | ICD-10-CM | POA: Diagnosis not present

## 2020-04-21 DIAGNOSIS — F4323 Adjustment disorder with mixed anxiety and depressed mood: Secondary | ICD-10-CM | POA: Diagnosis not present

## 2020-05-04 DIAGNOSIS — H43393 Other vitreous opacities, bilateral: Secondary | ICD-10-CM | POA: Diagnosis not present

## 2020-05-04 DIAGNOSIS — H35372 Puckering of macula, left eye: Secondary | ICD-10-CM | POA: Diagnosis not present

## 2020-05-18 DIAGNOSIS — H43821 Vitreomacular adhesion, right eye: Secondary | ICD-10-CM | POA: Diagnosis not present

## 2020-05-18 DIAGNOSIS — H43812 Vitreous degeneration, left eye: Secondary | ICD-10-CM | POA: Diagnosis not present

## 2020-05-18 DIAGNOSIS — H3581 Retinal edema: Secondary | ICD-10-CM | POA: Diagnosis not present

## 2020-05-18 DIAGNOSIS — H35372 Puckering of macula, left eye: Secondary | ICD-10-CM | POA: Diagnosis not present

## 2020-06-10 DIAGNOSIS — F4323 Adjustment disorder with mixed anxiety and depressed mood: Secondary | ICD-10-CM | POA: Diagnosis not present

## 2020-06-14 DIAGNOSIS — H3581 Retinal edema: Secondary | ICD-10-CM | POA: Diagnosis not present

## 2020-06-14 DIAGNOSIS — H35372 Puckering of macula, left eye: Secondary | ICD-10-CM | POA: Diagnosis not present

## 2020-06-15 DIAGNOSIS — H35372 Puckering of macula, left eye: Secondary | ICD-10-CM | POA: Diagnosis not present

## 2020-06-18 DIAGNOSIS — H35372 Puckering of macula, left eye: Secondary | ICD-10-CM | POA: Diagnosis not present

## 2020-07-15 DIAGNOSIS — F4323 Adjustment disorder with mixed anxiety and depressed mood: Secondary | ICD-10-CM | POA: Diagnosis not present

## 2020-07-16 DIAGNOSIS — H35372 Puckering of macula, left eye: Secondary | ICD-10-CM | POA: Diagnosis not present

## 2020-07-16 DIAGNOSIS — H3581 Retinal edema: Secondary | ICD-10-CM | POA: Diagnosis not present

## 2020-07-16 DIAGNOSIS — H43821 Vitreomacular adhesion, right eye: Secondary | ICD-10-CM | POA: Diagnosis not present

## 2020-07-23 DIAGNOSIS — R079 Chest pain, unspecified: Secondary | ICD-10-CM | POA: Diagnosis not present

## 2020-08-06 DIAGNOSIS — R079 Chest pain, unspecified: Secondary | ICD-10-CM | POA: Diagnosis not present

## 2020-08-12 ENCOUNTER — Ambulatory Visit: Payer: Self-pay | Admitting: Cardiology

## 2020-08-12 ENCOUNTER — Encounter: Payer: Self-pay | Admitting: Cardiology

## 2020-08-12 ENCOUNTER — Other Ambulatory Visit: Payer: Self-pay

## 2020-08-12 VITALS — BP 132/76 | HR 73 | Resp 15 | Ht 67.0 in | Wt 152.0 lb

## 2020-08-12 DIAGNOSIS — R072 Precordial pain: Secondary | ICD-10-CM

## 2020-08-12 DIAGNOSIS — R0609 Other forms of dyspnea: Secondary | ICD-10-CM | POA: Diagnosis not present

## 2020-08-12 DIAGNOSIS — E039 Hypothyroidism, unspecified: Secondary | ICD-10-CM | POA: Diagnosis not present

## 2020-08-12 DIAGNOSIS — R0600 Dyspnea, unspecified: Secondary | ICD-10-CM

## 2020-08-12 DIAGNOSIS — I10 Essential (primary) hypertension: Secondary | ICD-10-CM | POA: Diagnosis not present

## 2020-08-12 NOTE — Progress Notes (Signed)
Date:  08/12/2020   ID:  Jillian Rodriguez, DOB [birth year], MRN [id number]  PCP:  Shon Baton, MD  Cardiologist:  Rex Kras, DO, [facility] (established care 08/12/2020)  REASON FOR CONSULT: Chest Pain .  REQUESTING PHYSICIAN:  Shon Baton, MD [address] [place],  [place] [address]  Chief Complaint  Patient presents with  . New Patient (Initial Visit)  . Chest Pain    HPI  Jillian Rodriguez is a 59 y.o. female who presents to the office with a chief complaint of " chest pain evaluation." She is referred to the office at the request of Shon Baton, MD. Patient's past medical history and cardiovascular risk factors include: Fibromyalgia, hypothyroidism, hypertension, postmenopausal female.  Patient is accompanied by her husband Jillian Rodriguez at today's office visit.  She is referred to the office at the request of her primary care provider for evaluation management of chest pain.  Patient states that her chest pain started approximately 6 weeks ago, located substernally, but when it first occurred she was at home sitting.  The symptoms usually last for about 30 minutes at a time or after hours at length.  She describes it as a tightness-like sensation, located substernally, 6 out of 10 in intensity, nonradiating, no improving or worsening factors, not brought on by effort related activities, does not resolve with rest, usually self-limited.  She states couple weeks ago she had discomfort that was very concerning and goes to urgent care for further evaluation.  Patient states that she had an EKG and a chest x-ray and some blood work done and referred to cardiology for further evaluation and management.  Patient is also concerned about elevated blood pressures.  She checks her blood pressures periodically and according to the log her average blood pressure over the last 7 days is 134/79 and over the last 30 days is 133/80.  Patient states that she used to be on blood pressure medications i.e. olmesartan  but this was discontinued because she started having symptoms of dizziness.  She is no longer taking any antihypertensive medications.  No family history of premature coronary disease or sudden cardiac death.  Denies prior history of coronary artery disease, myocardial infarction, congestive heart failure, deep venous thrombosis, pulmonary embolism, stroke, transient ischemic attack.  FUNCTIONAL STATUS: No structured exercise program or daily routine.   ALLERGIES: Allergies  Allergen Reactions  . Penicillins Other (See Comments)    Did it involve swelling of the face/tongue/throat, SOB, or low BP? N/A Did it involve sudden or severe rash/hives, skin peeling, or any reaction on the inside of your mouth or nose? N/A Did you need to seek medical attention at a hospital or doctor's office? N/A When did it last happen?Child If all above answers are "NO", may proceed with cephalosporin use.      MEDICATION LIST PRIOR TO VISIT: Current Meds  Medication Sig  . ALPRAZolam (XANAX) 0.25 MG tablet Take 0.25 mg by mouth daily as needed.  Marland Kitchen b complex vitamins tablet Take 1 tablet by mouth daily.  Marland Kitchen CALCIUM-MAGNESIUM-ZINC PO Take 1 tablet by mouth daily.  . Cholecalciferol (VITAMIN D3 PO) Take by mouth. 3000 daily  . clobetasol cream (TEMOVATE) 0.05 % SMARTSIG:Sparingly Topical Every Other Day  . DULoxetine (CYMBALTA) 60 MG capsule Take 90 mg by mouth daily.   Marland Kitchen estradiol (MINIVELLE) 0.1 MG/24HR patch Place 1 patch onto the skin 2 (two) times a week.  . fluticasone (FLONASE) 50 MCG/ACT nasal spray Place 1 spray into both nostrils as  needed.   . Levocetirizine Dihydrochloride (XYZAL PO) Take by mouth.  . Multiple Vitamin (MULTIVITAMIN WITH MINERALS) TABS tablet Take 1 tablet by mouth daily.  . Omega-3 Fatty Acids (FISH OIL PO) Take 1 capsule by mouth daily.  Marland Kitchen rOPINIRole (REQUIP) 0.25 MG tablet Take 0.25 mg by mouth at bedtime.      PAST MEDICAL HISTORY: Past Medical History:  Diagnosis  Date  . Allergy   . Anxiety   . Depression   . Fibromyalgia   . Gallstones   . Hyperlipidemia   . IBS (irritable bowel syndrome)     PAST SURGICAL HISTORY: Past Surgical History:  Procedure Laterality Date  . CESAREAN SECTION     x 1  . CHOLECYSTECTOMY    . LAPAROSCOPY     endometriosis and uterine polyp removal  . OVARIAN CYST REMOVAL    . VAGINAL HYSTERECTOMY  04/2007   total    FAMILY HISTORY: The patient family history includes Breast cancer in her paternal grandmother; Colon polyps in her father; Heart disease in her father, maternal grandfather, and paternal uncle; Hypertension in her father; Kidney disease in her father.  SOCIAL HISTORY:  The patient  reports that she has never smoked. She has never used smokeless tobacco. She reports current alcohol use. She reports that she does not use drugs.  REVIEW OF SYSTEMS: Review of Systems  Constitutional: Negative for chills and fever.  HENT: Negative for hoarse voice and nosebleeds.   Eyes: Negative for discharge, double vision and pain.  Cardiovascular: Positive for chest pain and dyspnea on exertion. Negative for claudication, leg swelling, near-syncope, orthopnea, palpitations, paroxysmal nocturnal dyspnea and syncope.  Respiratory: Negative for hemoptysis and shortness of breath.   Musculoskeletal: Negative for muscle cramps and myalgias.  Gastrointestinal: Negative for abdominal pain, constipation, diarrhea, hematemesis, hematochezia, melena, nausea and vomiting.  Neurological: Positive for dizziness (chronic ) and light-headedness (chronic).    PHYSICAL EXAM: Vitals with BMI 08/12/2020 10/09/2019 10/09/2019  Height 5' 7"  - -  Weight 152 lbs - -  BMI 70.9 - -  Systolic 628 366 294  Diastolic 76 68 73  Pulse 73 73 80    CONSTITUTIONAL: Well-developed and well-nourished. No acute distress.  SKIN: Skin is warm and dry. No rash noted. No cyanosis. No pallor. No jaundice HEAD: Normocephalic and atraumatic.    EYES: No scleral icterus MOUTH/THROAT: Moist oral membranes.  NECK: No JVD present. No thyromegaly noted. No carotid bruits  LYMPHATIC: No visible cervical adenopathy.  CHEST Normal respiratory effort. No intercostal retractions  LUNGS: Clear to auscultation bilaterally.  No stridor. No wheezes. No rales.  CARDIOVASCULAR: Regular rate and rhythm, positive S1-S2, no murmurs rubs or gallops appreciated. ABDOMINAL: No apparent ascites.  EXTREMITIES: No peripheral edema  HEMATOLOGIC: No significant bruising NEUROLOGIC: Oriented to person, place, and time. Nonfocal. Normal muscle tone.  PSYCHIATRIC: Normal mood and affect. Normal behavior. Cooperative  CARDIAC DATABASE: EKG: 08/12/2020: Normal sinus rhythm, 66 bpm, normal axis, without underlying ischemia or injury pattern.  Echocardiogram: None  Stress Testing: None   Heart Catheterization: None   LABORATORY DATA:  External Labs: Collected: 07/28/2020 Creatinine 0.79 mg/dL. eGFR: 83 mL/min per 1.73 m Lipid profile: Total cholesterol 210, triglycerides 121, HDL 69, LDL 118, non-HDL 141 TSH: 5.53  IMPRESSION:    ICD-10-CM   1. Precordial chest pain  R07.2 EKG 12-Lead  2. Dyspnea on exertion  R06.00 CT CARDIAC SCORING    PCV ECHOCARDIOGRAM COMPLETE    PCV MYOCARDIAL PERFUSION WO LEXISCAN  3.  Hypothyroidism, unspecified type  E03.9   4. Benign hypertension  I10      RECOMMENDATIONS: Jillian Rodriguez is a 59 y.o. female whose past medical history and cardiac risk factors include: Fibromyalgia, hypothyroidism, hypertension, postmenopausal female.  Precordial chest pain.  Patient symptoms of chest discomfort appear to be atypical in nature.  EKG shows normal sinus rhythm without underlying ischemia or injury pattern.  Dyspnea on exertion:  Patient's dyspnea on exertion may be her anginal equivalent.  Recommend coronary artery calcification scoring for further with stratification.  Patient is LDL and non-HDL are mildly  elevated.  Her estimated 10-year risk of ASCVD is approximately 2.5%.  Recommend lifestyle modifications at this time and to reevaluate in 6 months.  Echocardiogram will be ordered to evaluate for structural heart disease and left ventricular systolic function.  Exercise nuclear stress test recommended to evaluate for reversible ischemia.  Benign essential hypertension:  Patient systolic blood pressures are relatively stable as per her 7 and 30-day averages.  However, the fact the patient is nondiabetic would recommend a systolic blood pressure of 120 mmHg.  She may benefit from a low-dose olmesartan which she tolerated well in the past.  Patient states that she will follow-up with her PCP for further management this time.  Hypothyroidism: Currently managed by primary care provider.  FINAL MEDICATION LIST END OF ENCOUNTER: No orders of the defined types were placed in this encounter.    Current Outpatient Medications:  .  ALPRAZolam (XANAX) 0.25 MG tablet, Take 0.25 mg by mouth daily as needed., Disp: , Rfl:  .  b complex vitamins tablet, Take 1 tablet by mouth daily., Disp: , Rfl:  .  CALCIUM-MAGNESIUM-ZINC PO, Take 1 tablet by mouth daily., Disp: , Rfl:  .  Cholecalciferol (VITAMIN D3 PO), Take by mouth. 3000 daily, Disp: , Rfl:  .  clobetasol cream (TEMOVATE) 0.05 %, SMARTSIG:Sparingly Topical Every Other Day, Disp: , Rfl:  .  DULoxetine (CYMBALTA) 60 MG capsule, Take 90 mg by mouth daily. , Disp: , Rfl:  .  estradiol (MINIVELLE) 0.1 MG/24HR patch, Place 1 patch onto the skin 2 (two) times a week., Disp: , Rfl:  .  fluticasone (FLONASE) 50 MCG/ACT nasal spray, Place 1 spray into both nostrils as needed. , Disp: , Rfl:  .  Levocetirizine Dihydrochloride (XYZAL PO), Take by mouth., Disp: , Rfl:  .  Multiple Vitamin (MULTIVITAMIN WITH MINERALS) TABS tablet, Take 1 tablet by mouth daily., Disp: , Rfl:  .  Omega-3 Fatty Acids (FISH OIL PO), Take 1 capsule by mouth daily., Disp: , Rfl:    .  rOPINIRole (REQUIP) 0.25 MG tablet, Take 0.25 mg by mouth at bedtime. , Disp: , Rfl:  .  olmesartan (BENICAR) 20 MG tablet, Take 20 mg by mouth daily. (Patient not taking: Reported on 08/12/2020), Disp: , Rfl:   Orders Placed This Encounter  Procedures  . CT CARDIAC SCORING  . PCV MYOCARDIAL PERFUSION WO LEXISCAN  . EKG 12-Lead  . PCV ECHOCARDIOGRAM COMPLETE    There are no Patient Instructions on file for this visit.   --Continue cardiac medications as reconciled in final medication list. --Return in about 4 weeks (around 09/09/2020) for review test results., re-evaluation of symptoms.. Or sooner if needed. --Continue follow-up with your primary care physician regarding the management of your other chronic comorbid conditions.  Patient's questions and concerns were addressed to her satisfaction. She voices understanding of the instructions provided during this encounter.   This note was created using a  voice recognition software as a result there may be grammatical errors inadvertently enclosed that do not reflect the nature of this encounter. Every attempt is made to correct such errors.  Rex Kras, [place], [facility]  Pager: [phone] Office: [phone]

## 2020-08-17 DIAGNOSIS — I10 Essential (primary) hypertension: Secondary | ICD-10-CM | POA: Diagnosis not present

## 2020-08-17 DIAGNOSIS — R946 Abnormal results of thyroid function studies: Secondary | ICD-10-CM | POA: Diagnosis not present

## 2020-08-18 ENCOUNTER — Ambulatory Visit: Payer: BC Managed Care – PPO

## 2020-08-18 ENCOUNTER — Other Ambulatory Visit: Payer: Self-pay

## 2020-08-18 DIAGNOSIS — R0600 Dyspnea, unspecified: Secondary | ICD-10-CM

## 2020-08-18 DIAGNOSIS — R0609 Other forms of dyspnea: Secondary | ICD-10-CM | POA: Diagnosis not present

## 2020-08-26 ENCOUNTER — Other Ambulatory Visit: Payer: Self-pay

## 2020-08-26 ENCOUNTER — Ambulatory Visit: Payer: BC Managed Care – PPO

## 2020-08-26 DIAGNOSIS — R0609 Other forms of dyspnea: Secondary | ICD-10-CM

## 2020-08-26 DIAGNOSIS — R0600 Dyspnea, unspecified: Secondary | ICD-10-CM

## 2020-09-27 ENCOUNTER — Other Ambulatory Visit: Payer: Self-pay

## 2020-09-27 ENCOUNTER — Ambulatory Visit: Payer: BC Managed Care – PPO | Admitting: Cardiology

## 2020-09-27 ENCOUNTER — Encounter: Payer: Self-pay | Admitting: Cardiology

## 2020-09-27 VITALS — BP 135/77 | HR 59 | Ht 67.0 in | Wt 151.0 lb

## 2020-09-27 DIAGNOSIS — R0609 Other forms of dyspnea: Secondary | ICD-10-CM | POA: Diagnosis not present

## 2020-09-27 DIAGNOSIS — R0600 Dyspnea, unspecified: Secondary | ICD-10-CM

## 2020-09-27 DIAGNOSIS — I10 Essential (primary) hypertension: Secondary | ICD-10-CM | POA: Diagnosis not present

## 2020-09-27 DIAGNOSIS — R072 Precordial pain: Secondary | ICD-10-CM

## 2020-09-27 DIAGNOSIS — Z712 Person consulting for explanation of examination or test findings: Secondary | ICD-10-CM

## 2020-09-27 NOTE — Progress Notes (Signed)
Date:  09/27/2020   ID:  Jillian Rodriguez, DOB 12/01/1961, MRN [id number]  PCP:  Jillian Baton, MD  Cardiologist:  Rex Kras, DO, [facility] (established care 08/12/2020)  Date: 09/27/20 Last Office Visit: 08/12/2020  Chief Complaint  Patient presents with  . Chest Pain    test results, pt has no cpmplaints  . Follow-up    HPI  Jillian Rodriguez is a 59 y.o. female who presents to the office with a chief complaint of " chest pain re-evaluation and review test results."  Patient's past medical history and cardiovascular risk factors include: Fibromyalgia, hypothyroidism, hypertension, postmenopausal female.  Patient is accompanied by her husband Jillian Rodriguez at today's office visit.  She is referred to the office at the request of her primary care provider for evaluation management of chest pain.  At last office visit patient was experiencing atypical chest pain and effort related dyspnea. Given her multiple cardiovascular risk factors recommend an ischemic evaluation with echocardiogram and stress test. Patient is noted to have a preserved LVEF per most recent echocardiogram without any significant valvular heart disease. And stress test was also performed during which the patient achieved 13.4 METS, 87% of age-predicted maximum heart rate, normal myocardial perfusion overall low risk study.  Since last visit patient states that her chest pain and shortness of breath have essentially resolved.  No hospitalizations or urgent care visits for cardiovascular symptoms.  No family history of premature coronary disease or sudden cardiac death.  Denies prior history of coronary artery disease, myocardial infarction, congestive heart failure, deep venous thrombosis, pulmonary embolism, stroke, transient ischemic attack.  FUNCTIONAL STATUS: No structured exercise program or daily routine.   ALLERGIES: Allergies  Allergen Reactions  . Penicillins Other (See Comments)    Did it involve swelling of the  face/tongue/throat, SOB, or low BP? N/A Did it involve sudden or severe rash/hives, skin peeling, or any reaction on the inside of your mouth or nose? N/A Did you need to seek medical attention at a hospital or doctor's office? N/A When did it last happen?Child If all above answers are "NO", may proceed with cephalosporin use.      MEDICATION LIST PRIOR TO VISIT: Current Meds  Medication Sig  . ALPRAZolam (XANAX) 0.25 MG tablet Take 0.25 mg by mouth daily as needed.  Marland Kitchen b complex vitamins tablet Take 1 tablet by mouth daily.  Marland Kitchen CALCIUM-MAGNESIUM-ZINC PO Take 1 tablet by mouth daily.  . Cholecalciferol (VITAMIN D3 PO) Take by mouth. 3000 daily  . clobetasol cream (TEMOVATE) 0.05 % SMARTSIG:Sparingly Topical Every Other Day  . DULoxetine (CYMBALTA) 60 MG capsule Take 90 mg by mouth daily.   Marland Kitchen estradiol (MINIVELLE) 0.1 MG/24HR patch Place 1 patch onto the skin 2 (two) times a week.  . fluticasone (FLONASE) 50 MCG/ACT nasal spray Place 1 spray into both nostrils as needed.   . Levocetirizine Dihydrochloride (XYZAL PO) Take by mouth.  . Multiple Vitamin (MULTIVITAMIN WITH MINERALS) TABS tablet Take 1 tablet by mouth daily.  . Omega-3 Fatty Acids (FISH OIL PO) Take 1 capsule by mouth daily.  Marland Kitchen rOPINIRole (REQUIP) 0.25 MG tablet Take 0.25 mg by mouth at bedtime.      PAST MEDICAL HISTORY: Past Medical History:  Diagnosis Date  . Allergy   . Anxiety   . Depression   . Fibromyalgia   . Gallstones   . Hyperlipidemia   . IBS (irritable bowel syndrome)     PAST SURGICAL HISTORY: Past Surgical History:  Procedure Laterality Date  .  CESAREAN SECTION     x 1  . CHOLECYSTECTOMY    . LAPAROSCOPY     endometriosis and uterine polyp removal  . OVARIAN CYST REMOVAL    . VAGINAL HYSTERECTOMY  04/2007   total    FAMILY HISTORY: The patient family history includes Breast cancer in her paternal grandmother; Colon polyps in her father; Heart disease in her father, maternal grandfather,  and paternal uncle; Hypertension in her father; Kidney disease in her father.  SOCIAL HISTORY:  The patient  reports that she has never smoked. She has never used smokeless tobacco. She reports current alcohol use. She reports that she does not use drugs.  REVIEW OF SYSTEMS: Review of Systems  Constitutional: Negative for chills and fever.  HENT: Negative for hoarse voice and nosebleeds.   Eyes: Negative for discharge, double vision and pain.  Cardiovascular: Negative for chest pain, claudication, dyspnea on exertion, leg swelling, near-syncope, orthopnea, palpitations, paroxysmal nocturnal dyspnea and syncope.  Respiratory: Negative for hemoptysis and shortness of breath.   Musculoskeletal: Negative for muscle cramps and myalgias.  Gastrointestinal: Negative for abdominal pain, constipation, diarrhea, hematemesis, hematochezia, melena, nausea and vomiting.  Neurological: Positive for dizziness (chronic and improving) and light-headedness (chronic but improving.).    PHYSICAL EXAM: Vitals with BMI 09/27/2020 08/12/2020 10/09/2019  Height _0  _1  -  Weight 151 lbs 152 lbs -  BMI 16.10 96.0 -  Systolic 454 098 119  Diastolic 77 76 68  Pulse 59 73 73    CONSTITUTIONAL: Well-developed and well-nourished. No acute distress.  SKIN: Skin is warm and dry. No rash noted. No cyanosis. No pallor. No jaundice HEAD: Normocephalic and atraumatic.  EYES: No scleral icterus MOUTH/THROAT: Moist oral membranes.  NECK: No JVD present. No thyromegaly noted. No carotid bruits  LYMPHATIC: No visible cervical adenopathy.  CHEST Normal respiratory effort. No intercostal retractions  LUNGS: Clear to auscultation bilaterally.  No stridor. No wheezes. No rales.  CARDIOVASCULAR: Regular rate and rhythm, positive S1-S2, no murmurs rubs or gallops appreciated. ABDOMINAL: No apparent ascites.  EXTREMITIES: No peripheral edema  HEMATOLOGIC: No significant bruising NEUROLOGIC: Oriented to person, place, and  time. Nonfocal. Normal muscle tone.  PSYCHIATRIC: Normal mood and affect. Normal behavior. Cooperative  CARDIAC DATABASE: EKG: 08/12/2020: Normal sinus rhythm, 66 bpm, normal axis, without underlying ischemia or injury pattern.  Echocardiogram: 08/26/2020:  1. Normal LV systolic function with visual EF 60-65%. Left ventricle cavity is normal in size. Mild to moderate left ventricular hypertrophy. Normal global wall motion. Indeterminate diastolic filling pattern, normal LAP. Calculated EF 68%.  2. Left atrial cavity is mildly dilated.  3. Mild (Grade I) mitral regurgitation.  4. Mild tricuspid regurgitation.  5. No prior study for comparison  Stress Testing: Exercise Myoview stress test 08/18/2020:  Exercise nuclear stress test was performed using Bruce protocol. Patient reached 13.4 METS, and 87% of age predicted maximum heart rate. Exercise capacity was excellent. Chest pain not reported. Heart rate and hemodynamic response were normak. Stress EKG revealed no ischemic changes.  Normal myocardial perfusion. Stress LVEF 70%.  Low risk study.   Coronary calcium scoring study 08/18/2020 at [facility]:  Total CAC score: 0   Heart Catheterization: None   LABORATORY DATA:  External Labs: Collected: 07/28/2020 Creatinine 0.79 mg/dL. eGFR: 83 mL/min per 1.73 m Lipid profile: Total cholesterol 210, triglycerides 121, HDL 69, LDL 118, non-HDL 141 TSH: 5.53  IMPRESSION:    ICD-10-CM   1. Precordial chest pain  R07.2   2. Dyspnea on exertion  R06.00   3. Encounter to discuss test results  Z71.2   4. Benign hypertension  I10      RECOMMENDATIONS: YZABELLA CRUNK is a 59 y.o. female whose past medical history and cardiac risk factors include: Fibromyalgia, hypothyroidism, hypertension, postmenopausal female.  Precordial chest pain and effort related dyspnea:  At the last office visit patient was experiencing atypical chest pain and effort related dyspnea both of which have resolved since  then. In the interim she has undergone ischemic evaluation including a coronary artery calcium score, echocardiogram, and stress test. Patient's total coronary artery calcification score is zero as noted above. Echocardiogram notes a preserved LVEF without any significant valvular heart disease. Good exercise capacity with normal myocardial perfusion on recent stress test.  Educated on importance of improving her cardiovascular risk factors.  Continue to monitor for now.  Benign essential hypertension:  Since last office visit she is no longer on Benicar. She states this was a shared decision at her last PCP visit.   Will defer management to her PCP.   Hypothyroidism: Currently managed by primary care provider.  Patient is instructed to have a repeat echocardiogram in 3 years to reevaluate her underlying valvular heart disease or sooner if change in clinical status.  FINAL MEDICATION LIST END OF ENCOUNTER: No orders of the defined types were placed in this encounter.    Current Outpatient Medications:  .  ALPRAZolam (XANAX) 0.25 MG tablet, Take 0.25 mg by mouth daily as needed., Disp: , Rfl:  .  b complex vitamins tablet, Take 1 tablet by mouth daily., Disp: , Rfl:  .  CALCIUM-MAGNESIUM-ZINC PO, Take 1 tablet by mouth daily., Disp: , Rfl:  .  Cholecalciferol (VITAMIN D3 PO), Take by mouth. 3000 daily, Disp: , Rfl:  .  clobetasol cream (TEMOVATE) 0.05 %, SMARTSIG:Sparingly Topical Every Other Day, Disp: , Rfl:  .  DULoxetine (CYMBALTA) 60 MG capsule, Take 90 mg by mouth daily. , Disp: , Rfl:  .  estradiol (MINIVELLE) 0.1 MG/24HR patch, Place 1 patch onto the skin 2 (two) times a week., Disp: , Rfl:  .  fluticasone (FLONASE) 50 MCG/ACT nasal spray, Place 1 spray into both nostrils as needed. , Disp: , Rfl:  .  Levocetirizine Dihydrochloride (XYZAL PO), Take by mouth., Disp: , Rfl:  .  Multiple Vitamin (MULTIVITAMIN WITH MINERALS) TABS tablet, Take 1 tablet by mouth daily., Disp: , Rfl:   .  Omega-3 Fatty Acids (FISH OIL PO), Take 1 capsule by mouth daily., Disp: , Rfl:  .  rOPINIRole (REQUIP) 0.25 MG tablet, Take 0.25 mg by mouth at bedtime. , Disp: , Rfl:   No orders of the defined types were placed in this encounter.  There are no Patient Instructions on file for this visit.   --Continue cardiac medications as reconciled in final medication list. --Return in about 1 year (around 09/27/2021) for Reevaluation of symptoms and monitor MR and TR. Or sooner if needed. --Continue follow-up with your primary care physician regarding the management of your other chronic comorbid conditions.  Patient's questions and concerns were addressed to her satisfaction. She voices understanding of the instructions provided during this encounter.   This note was created using a voice recognition software as a result there may be grammatical errors inadvertently enclosed that do not reflect the nature of this encounter. Every attempt is made to correct such errors.  Total time spent: 31 minutes.  Rex Kras, [place], [facility]  Pager: [phone] Office: [phone]

## 2020-09-28 ENCOUNTER — Other Ambulatory Visit: Payer: Self-pay

## 2020-09-28 DIAGNOSIS — R0600 Dyspnea, unspecified: Secondary | ICD-10-CM

## 2020-09-28 DIAGNOSIS — R0609 Other forms of dyspnea: Secondary | ICD-10-CM

## 2020-09-29 DIAGNOSIS — Z20828 Contact with and (suspected) exposure to other viral communicable diseases: Secondary | ICD-10-CM | POA: Diagnosis not present

## 2020-10-13 DIAGNOSIS — H3581 Retinal edema: Secondary | ICD-10-CM | POA: Diagnosis not present

## 2020-10-13 DIAGNOSIS — H43821 Vitreomacular adhesion, right eye: Secondary | ICD-10-CM | POA: Diagnosis not present

## 2020-10-26 DIAGNOSIS — J0100 Acute maxillary sinusitis, unspecified: Secondary | ICD-10-CM | POA: Diagnosis not present

## 2020-10-26 DIAGNOSIS — H6981 Other specified disorders of Eustachian tube, right ear: Secondary | ICD-10-CM | POA: Diagnosis not present

## 2021-02-07 DIAGNOSIS — Z1231 Encounter for screening mammogram for malignant neoplasm of breast: Secondary | ICD-10-CM | POA: Diagnosis not present

## 2021-02-07 DIAGNOSIS — Z13820 Encounter for screening for osteoporosis: Secondary | ICD-10-CM | POA: Diagnosis not present

## 2021-02-07 DIAGNOSIS — Z01419 Encounter for gynecological examination (general) (routine) without abnormal findings: Secondary | ICD-10-CM | POA: Diagnosis not present

## 2021-02-07 DIAGNOSIS — Z6823 Body mass index (BMI) 23.0-23.9, adult: Secondary | ICD-10-CM | POA: Diagnosis not present

## 2021-02-10 DIAGNOSIS — R946 Abnormal results of thyroid function studies: Secondary | ICD-10-CM | POA: Diagnosis not present

## 2021-02-10 DIAGNOSIS — M859 Disorder of bone density and structure, unspecified: Secondary | ICD-10-CM | POA: Diagnosis not present

## 2021-02-10 DIAGNOSIS — I10 Essential (primary) hypertension: Secondary | ICD-10-CM | POA: Diagnosis not present

## 2021-02-10 DIAGNOSIS — R7989 Other specified abnormal findings of blood chemistry: Secondary | ICD-10-CM | POA: Diagnosis not present

## 2021-02-16 DIAGNOSIS — H04123 Dry eye syndrome of bilateral lacrimal glands: Secondary | ICD-10-CM | POA: Diagnosis not present

## 2021-02-16 DIAGNOSIS — H43821 Vitreomacular adhesion, right eye: Secondary | ICD-10-CM | POA: Diagnosis not present

## 2021-02-16 DIAGNOSIS — H26491 Other secondary cataract, right eye: Secondary | ICD-10-CM | POA: Diagnosis not present

## 2021-02-17 DIAGNOSIS — R82998 Other abnormal findings in urine: Secondary | ICD-10-CM | POA: Diagnosis not present

## 2021-02-17 DIAGNOSIS — Z0000 Encounter for general adult medical examination without abnormal findings: Secondary | ICD-10-CM | POA: Diagnosis not present

## 2021-02-17 DIAGNOSIS — I10 Essential (primary) hypertension: Secondary | ICD-10-CM | POA: Diagnosis not present

## 2021-03-22 DIAGNOSIS — H1031 Unspecified acute conjunctivitis, right eye: Secondary | ICD-10-CM | POA: Diagnosis not present

## 2021-05-06 ENCOUNTER — Telehealth: Payer: Self-pay

## 2021-05-06 NOTE — Telephone Encounter (Signed)
Her symptoms are very vague but if her discomfort is getting worse in intensity, frequency, duration she should go to ER for further evaluation. If her symptoms are relative stable / chronic she can see either me or CC.   Last ischemic workup for review: EF presevered, stress test low risk and calcium score is 0.

## 2021-05-06 NOTE — Telephone Encounter (Signed)
Called and spoke to pt, pt is trying to set up an appointment with the front now.

## 2021-05-06 NOTE — Telephone Encounter (Signed)
Pt called because she was on an anxiety pill and was only on 10mg  and wanted a new perscription from her PCP. She explained to her PCP yesterday that she has been feeling like there is a lump in her thraot and tightness in her chest. Pt was thinking it may have been GERD but her PCP told her to contact us. Pt has had tingling in her upper back and shoulder blades and some mild jaw pain. Pts BP is 136/78, Her heart rate is 69. Pt still has the heaviness in her chest today and tightness in her chest. Pt does have fibromyalgia and is not sure if that could cause this as well.

## 2021-05-09 DIAGNOSIS — H00021 Hordeolum internum right upper eyelid: Secondary | ICD-10-CM | POA: Diagnosis not present

## 2021-05-09 NOTE — Progress Notes (Signed)
Date:  05/10/2021   ID:  Jillian Rodriguez, DOB 1961/02/23, MRN [id number]  PCP:  Shon Baton, MD  Cardiologist:  Alethia Berthold, DO, [facility] (established care 08/12/2020)  Date: 05/10/21 Last Office Visit: 08/12/2020  Chief Complaint  Patient presents with  . Chest Pain  . Shortness of Breath    HPI  Jillian Rodriguez is a 60 y.o. female who presents to the office with a chief complaint of " chest pain re-evaluation and review test results."  Patient's past medical history and cardiovascular risk factors include: Fibromyalgia, hypothyroidism, hypertension, postmenopausal female.  She was originally referred to the office at the request of her PCP for evaluation and management of chest pain. Patient was experiencing atypical chest pain and effort related dyspnea. Given her multiple cardiovascular risk factors recommend an ischemic evaluation with echocardiogram and stress test. Patient is noted to have a preserved LVEF per most recent echocardiogram without any significant valvular heart disease. And stress test was also performed during which the patient achieved 13.4 METS, 87% of age-predicted maximum heart rate, normal myocardial perfusion overall low risk study.  Patient is accompanied by her husband at the bedside.  Patient presents for urgent visit with concerns of intermittent chest discomfort which she describes as "heaviness" or "sharp pain".  Patient has noticed increased symptoms over the last 2 weeks.  Also reports in the same timeframe she has had more frequent episodes of GERD and feeling like "there is a lump in her throat".  Patient states many of the symptoms seem to be triggered by anxiety and stress at work.  She reports feeling of heaviness in her chest occurs primarily at work while sitting, and lasts about a maximum of 1 hour.  Reports symptoms of sharp chest pain have occurred at night primarily lasting less than 1 minute.  Patient takes as needed Xanax, which improves chest  discomfort symptoms.  Patient does note as well over the last 2 weeks she has had occasional episodes of palpitations which she describes as "fluttering" lasting several seconds without identifiable triggers.  Patient denies dizziness, dyspnea, syncope, near syncope, leg swelling.  No hospitalizations or urgent care visits for cardiovascular symptoms.  No family history of premature coronary disease or sudden cardiac death.  Denies prior history of coronary artery disease, myocardial infarction, congestive heart failure, deep venous thrombosis, pulmonary embolism, stroke, transient ischemic attack.  FUNCTIONAL STATUS: No structured exercise program or daily routine.   ALLERGIES: Allergies  Allergen Reactions  . Penicillins Other (See Comments)    Did it involve swelling of the face/tongue/throat, SOB, or low BP? N/A Did it involve sudden or severe rash/hives, skin peeling, or any reaction on the inside of your mouth or nose? N/A Did you need to seek medical attention at a hospital or doctor's office? N/A When did it last happen?Child If all above answers are "NO", may proceed with cephalosporin use.      MEDICATION LIST PRIOR TO VISIT: Current Meds  Medication Sig  . ALPRAZolam (XANAX) 0.25 MG tablet Take 0.25 mg by mouth daily as needed.  Marland Kitchen b complex vitamins tablet Take 1 tablet by mouth daily.  Marland Kitchen CALCIUM-MAGNESIUM-ZINC PO Take 1 tablet by mouth daily.  . Cholecalciferol (VITAMIN D3 PO) Take by mouth. 3000 daily  . clobetasol cream (TEMOVATE) 0.05 % SMARTSIG:Sparingly Topical Every Other Day  . DULoxetine (CYMBALTA) 60 MG capsule Take 90 mg by mouth daily.   Marland Kitchen estradiol (VIVELLE-DOT) 0.1 MG/24HR patch Place 1 patch onto the skin  2 (two) times a week.  . Levocetirizine Dihydrochloride (XYZAL PO) Take by mouth.  . Multiple Vitamin (MULTIVITAMIN WITH MINERALS) TABS tablet Take 1 tablet by mouth daily.  . Omega-3 Fatty Acids (FISH OIL PO) Take 1 capsule by mouth daily.  Marland Kitchen  rOPINIRole (REQUIP) 0.25 MG tablet Take 0.25 mg by mouth at bedtime.      PAST MEDICAL HISTORY: Past Medical History:  Diagnosis Date  . Allergy   . Anxiety   . Depression   . Fibromyalgia   . Gallstones   . Hyperlipidemia   . IBS (irritable bowel syndrome)     PAST SURGICAL HISTORY: Past Surgical History:  Procedure Laterality Date  . CESAREAN SECTION     x 1  . CHOLECYSTECTOMY    . LAPAROSCOPY     endometriosis and uterine polyp removal  . OVARIAN CYST REMOVAL    . VAGINAL HYSTERECTOMY  04/2007   total    FAMILY HISTORY: The patient family history includes Atrial fibrillation in her mother; Breast cancer in her paternal grandmother; Colon polyps in her father; Heart disease in her father, maternal grandfather, mother, and paternal uncle; Hypertension in her father; Kidney disease in her father.  SOCIAL HISTORY:  The patient  reports that she has never smoked. She has never used smokeless tobacco. She reports current alcohol use. She reports that she does not use drugs.  REVIEW OF SYSTEMS: Review of Systems  Constitutional: Negative for chills and fever.  HENT: Negative for hoarse voice and nosebleeds.   Eyes: Negative for discharge, double vision and pain.  Cardiovascular: Positive for chest pain and palpitations. Negative for claudication, dyspnea on exertion, leg swelling, near-syncope, orthopnea, paroxysmal nocturnal dyspnea and syncope.  Respiratory: Negative for hemoptysis and shortness of breath.   Musculoskeletal: Negative for muscle cramps and myalgias.  Gastrointestinal: Negative for abdominal pain, constipation, diarrhea, hematemesis, hematochezia, melena, nausea and vomiting.  Neurological: Positive for dizziness (chronic and improving) and light-headedness (chronic but improving.).    PHYSICAL EXAM: Vitals with BMI 05/10/2021 09/27/2020 08/12/2020  Height _0  _1  _2   Weight 151 lbs 151 lbs 152 lbs  BMI 23.64 62.83 15.1  Systolic 761 607 371   Diastolic 83 77 76  Pulse 68 59 73    CONSTITUTIONAL: Well-developed and well-nourished. No acute distress.  SKIN: Skin is warm and dry. No rash noted. No cyanosis. No pallor. No jaundice HEAD: Normocephalic and atraumatic.  EYES: No scleral icterus MOUTH/THROAT: Moist oral membranes.  NECK: No JVD present. No thyromegaly noted. No carotid bruits  LYMPHATIC: No visible cervical adenopathy.  CHEST Normal respiratory effort. No intercostal retractions  LUNGS: Clear to auscultation bilaterally.  No stridor. No wheezes. No rales.  CARDIOVASCULAR: Regular rate and rhythm, positive S1-S2, no murmurs rubs or gallops appreciated. ABDOMINAL: No apparent ascites.  EXTREMITIES: No peripheral edema  HEMATOLOGIC: No significant bruising NEUROLOGIC: Oriented to person, place, and time. Nonfocal. Normal muscle tone.  PSYCHIATRIC: Normal mood and affect. Normal behavior. Cooperative  CARDIAC DATABASE: EKG: 08/12/2020: Normal sinus rhythm, 66 bpm, normal axis, without underlying ischemia or injury pattern. 05/10/2021: Sinus rhythm at a rate of 64 bpm.  Normal axis.  No evidence of ischemia or underlying injury pattern.  Echocardiogram: 08/26/2020:  1. Normal LV systolic function with visual EF 60-65%. Left ventricle cavity is normal in size. Mild to moderate left ventricular hypertrophy. Normal global wall motion. Indeterminate diastolic filling pattern, normal LAP. Calculated EF 68%.  2. Left atrial cavity is mildly dilated.  3. Mild (Grade  I) mitral regurgitation.  4. Mild tricuspid regurgitation.  5. No prior study for comparison  Stress Testing: Exercise Myoview stress test 08/18/2020:  Exercise nuclear stress test was performed using Bruce protocol. Patient reached 13.4 METS, and 87% of age predicted maximum heart rate. Exercise capacity was excellent. Chest pain not reported. Heart rate and hemodynamic response were normak. Stress EKG revealed no ischemic changes.  Normal myocardial  perfusion. Stress LVEF 70%.  Low risk study.   Coronary calcium scoring study 08/18/2020 at [facility]:  Total CAC score: 0   Heart Catheterization: None   LABORATORY DATA:  External Labs: Collected: 07/28/2020 Creatinine 0.79 mg/dL. eGFR: 83 mL/min per 1.73 m Lipid profile: Total cholesterol 210, triglycerides 121, HDL 69, LDL 118, non-HDL 141 TSH: 5.53  IMPRESSION:    ICD-10-CM   1. Precordial pain  R07.2 EKG 12-Lead  2. Benign hypertension  I10   3. Palpitations  R00.2      RECOMMENDATIONS: Jillian Rodriguez is a 60 y.o. female whose past medical history and cardiac risk factors include: Fibromyalgia, hypothyroidism, hypertension, postmenopausal female.  Precordial chest pain:  Patient reports episodes of atypical chest pain over the last 2 weeks. EKG unchanged compared to previous, without evidence of ischemia or underlying injury.  Again reviewed and discussed with patient regarding recent cardiovascular work-up including echocardiogram, stress test, and coronary calcium score.  Details of cardiovascular evaluation above.   In view of coronary calcium score of 0, echocardiogram without significant abnormality, and normal myocardial perfusion on stress test suspicion for underlying ischemia is low.  Reassured patient.  Also counseled her and her husband who is present at bedside regarding signs and symptoms that would warrant urgent or emergent evaluation, they verbalized understanding agreement.  Continue to monitor for now.  Also advised patient to follow-up with PCP regarding concerns of anxiety and GERD.  Palpitations:  Patient has rare brief episodes of palpitations lasting several seconds.  Discussed with patient regarding management options including cardiac monitor versus watchful waiting.  Shared decision was to proceed with watchful waiting.  Counseled patient regarding signs symptoms that would warrant urgent evaluation, she agrees to notify our office if symptoms  worsen.  Benign essential hypertension:  Blood pressure elevated in the office today.  Benicar had previously been discontinued per patient's PCP.  Advised patient to monitor blood pressure on a regular basis at home.  Will defer further management to PCP.  Hypothyroidism: Currently managed by primary care provider.  Patient is instructed to have a repeat echocardiogram in 3 years to reevaluate her underlying valvular heart disease or sooner if change in clinical status.  FINAL MEDICATION LIST END OF ENCOUNTER: No orders of the defined types were placed in this encounter.    Current Outpatient Medications:  .  ALPRAZolam (XANAX) 0.25 MG tablet, Take 0.25 mg by mouth daily as needed., Disp: , Rfl:  .  b complex vitamins tablet, Take 1 tablet by mouth daily., Disp: , Rfl:  .  CALCIUM-MAGNESIUM-ZINC PO, Take 1 tablet by mouth daily., Disp: , Rfl:  .  Cholecalciferol (VITAMIN D3 PO), Take by mouth. 3000 daily, Disp: , Rfl:  .  clobetasol cream (TEMOVATE) 0.05 %, SMARTSIG:Sparingly Topical Every Other Day, Disp: , Rfl:  .  DULoxetine (CYMBALTA) 60 MG capsule, Take 90 mg by mouth daily. , Disp: , Rfl:  .  estradiol (VIVELLE-DOT) 0.1 MG/24HR patch, Place 1 patch onto the skin 2 (two) times a week., Disp: , Rfl:  .  Levocetirizine Dihydrochloride (XYZAL PO), Take by  mouth., Disp: , Rfl:  .  Multiple Vitamin (MULTIVITAMIN WITH MINERALS) TABS tablet, Take 1 tablet by mouth daily., Disp: , Rfl:  .  Omega-3 Fatty Acids (FISH OIL PO), Take 1 capsule by mouth daily., Disp: , Rfl:  .  rOPINIRole (REQUIP) 0.25 MG tablet, Take 0.25 mg by mouth at bedtime. , Disp: , Rfl:  .  oxybutynin (DITROPAN-XL) 10 MG 24 hr tablet, Take 10 mg by mouth daily., Disp: , Rfl:   Orders Placed This Encounter  Procedures  . EKG 12-Lead   There are no Patient Instructions on file for this visit.   --Continue cardiac medications as reconciled in final medication list. --Return in about 6 weeks (around 06/21/2021) for  palpitations, HTN . Or sooner if needed. --Continue follow-up with your primary care physician regarding the management of your other chronic comorbid conditions.  Patient's questions and concerns were addressed to her satisfaction. She voices understanding of the instructions provided during this encounter.   This note was created using a voice recognition software as a result there may be grammatical errors inadvertently enclosed that do not reflect the nature of this encounter. Every attempt is made to correct such errors.  Total time spent: 28 minutes    Alethia Berthold, PA-C 05/10/2021, 10:58 AM Office: [phone]

## 2021-05-10 ENCOUNTER — Ambulatory Visit: Payer: BC Managed Care – PPO | Admitting: Student

## 2021-05-10 ENCOUNTER — Encounter: Payer: Self-pay | Admitting: Student

## 2021-05-10 ENCOUNTER — Other Ambulatory Visit: Payer: Self-pay

## 2021-05-10 VITALS — BP 143/83 | HR 68 | Temp 98.0°F | Resp 17 | Ht 67.0 in | Wt 151.0 lb

## 2021-05-10 DIAGNOSIS — I10 Essential (primary) hypertension: Secondary | ICD-10-CM

## 2021-05-10 DIAGNOSIS — R0600 Dyspnea, unspecified: Secondary | ICD-10-CM

## 2021-05-10 DIAGNOSIS — R002 Palpitations: Secondary | ICD-10-CM

## 2021-05-10 DIAGNOSIS — R072 Precordial pain: Secondary | ICD-10-CM

## 2021-05-10 DIAGNOSIS — R0609 Other forms of dyspnea: Secondary | ICD-10-CM | POA: Diagnosis not present

## 2021-06-21 ENCOUNTER — Ambulatory Visit: Payer: BC Managed Care – PPO | Admitting: Student

## 2021-08-17 DIAGNOSIS — H43821 Vitreomacular adhesion, right eye: Secondary | ICD-10-CM | POA: Diagnosis not present

## 2021-08-17 DIAGNOSIS — H353111 Nonexudative age-related macular degeneration, right eye, early dry stage: Secondary | ICD-10-CM | POA: Diagnosis not present

## 2021-09-02 ENCOUNTER — Ambulatory Visit: Payer: BC Managed Care – PPO | Admitting: Cardiology

## 2021-09-05 DIAGNOSIS — H43391 Other vitreous opacities, right eye: Secondary | ICD-10-CM | POA: Diagnosis not present

## 2021-09-14 ENCOUNTER — Ambulatory Visit: Payer: BC Managed Care – PPO | Admitting: Cardiology

## 2021-09-14 IMAGING — CT CT HEAD W/O CM
4 series · 16 of 47 positions shown, 18 images · non-contrast
Comparison: None.

CLINICAL DATA: Vertigo confusion

EXAM:
CT HEAD WITHOUT CONTRAST
TECHNIQUE: Contiguous axial images were obtained from the base of the skull
through the vertex without intravenous contrast.

[Series 3: head without · axial · non-contrast · 0.39mm/px · z∈[-168,-53]mm · 7 of 31 slices shown, 9 images]
[im 4/31  brain]
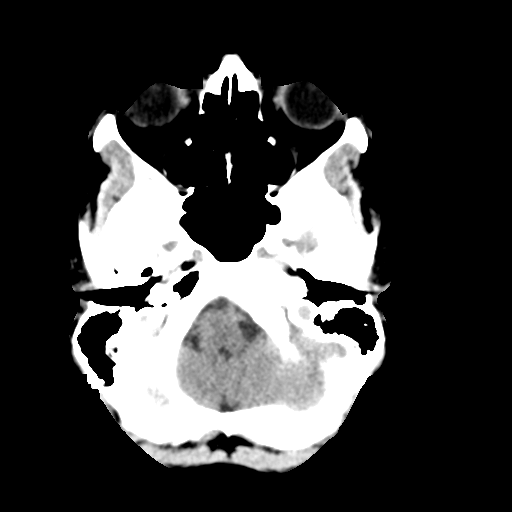
[im 4/31  bone]
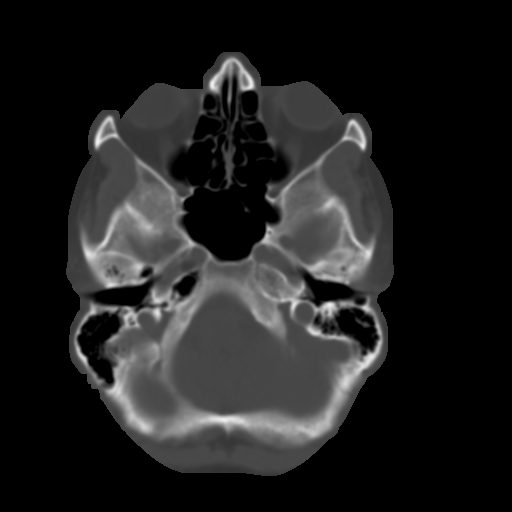
[im 8/31  brain]
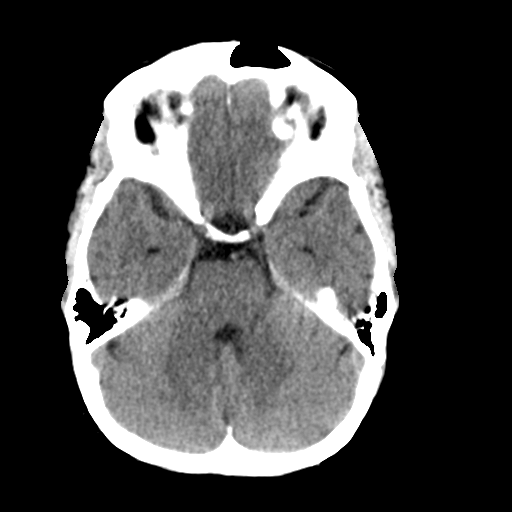
[im 12/31  brain]
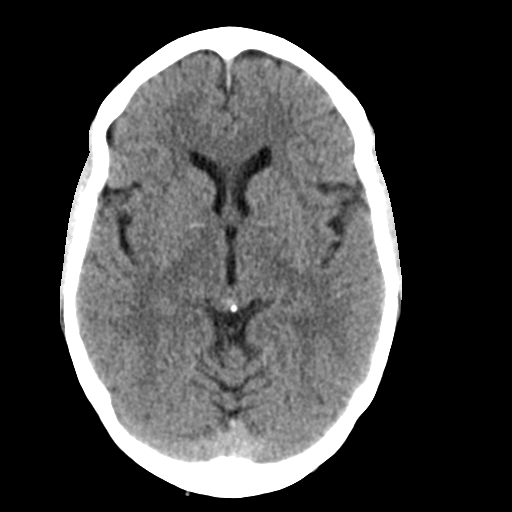
[im 16/31  brain]
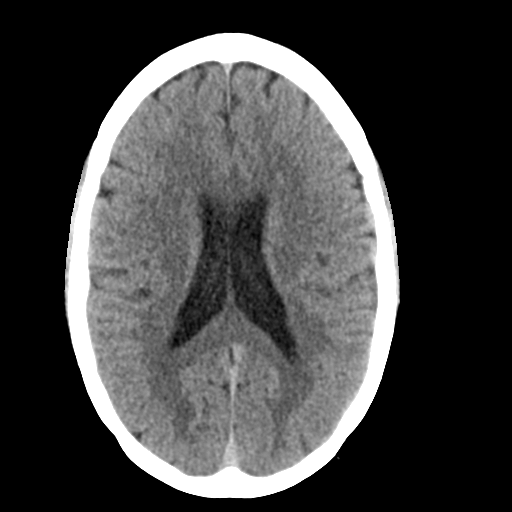
[im 19/31  brain]
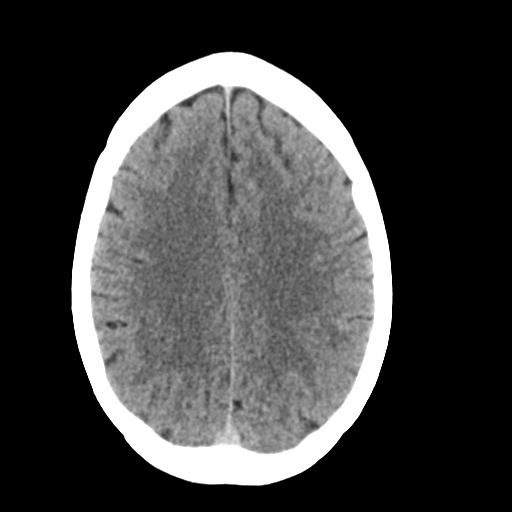
[im 19/31  bone]
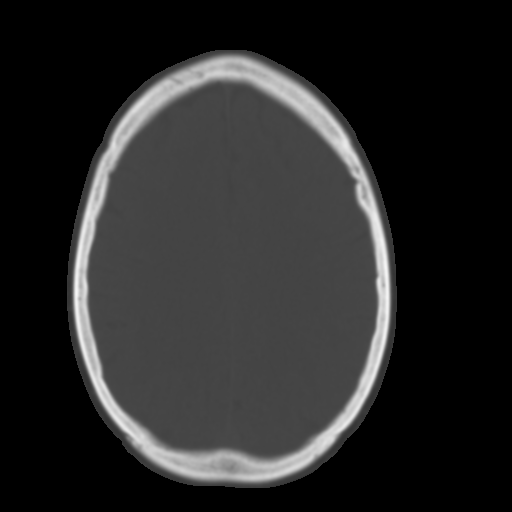
[im 23/31  brain]
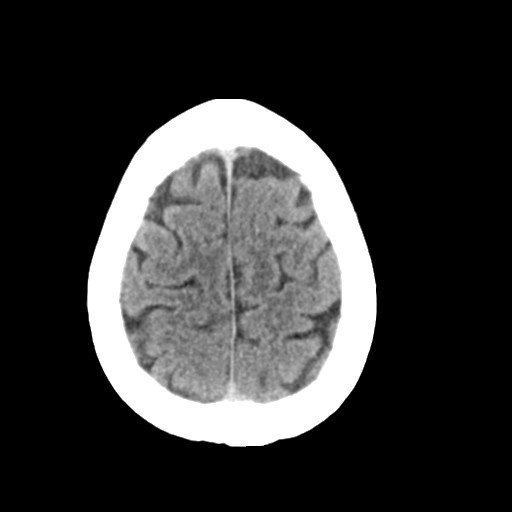
[im 27/31  brain]
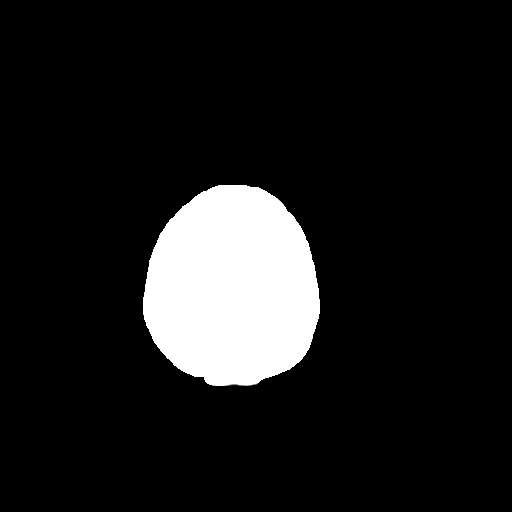

[Series 4: head bone · axial · 0.39mm/px · z∈[-169,-139]mm · 3 of 77 slices shown]
[im 8/77  bone]
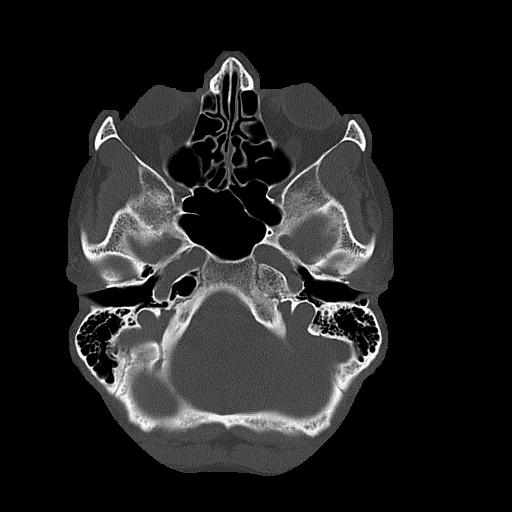
[im 16/77  bone]
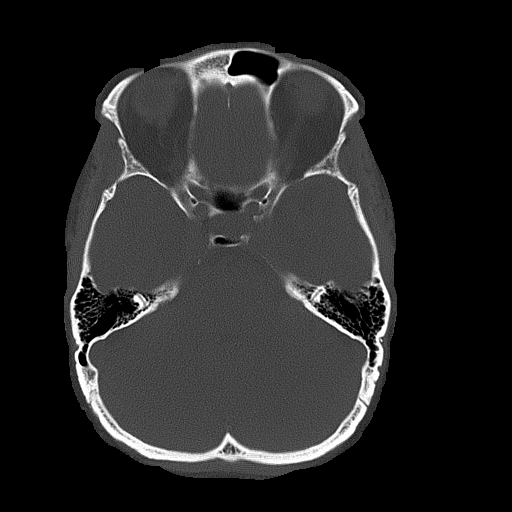
[im 23/77  bone]
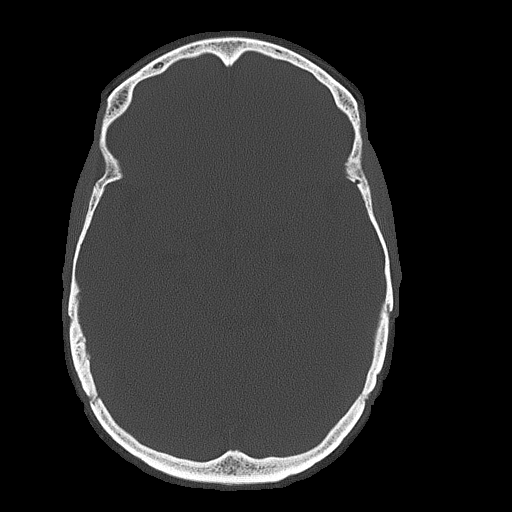

[Series 5: head without cor · coronal · non-contrast · 0.37mm/px · 3 of 67 slices shown]
[im 23/67  brain]
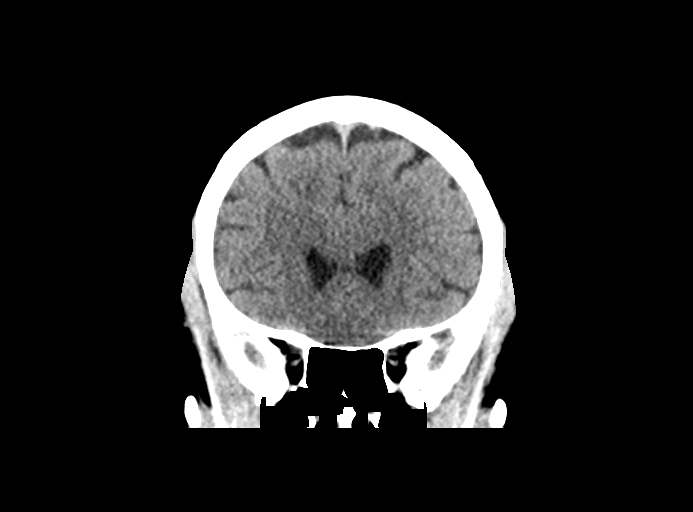
[im 30/67  brain]
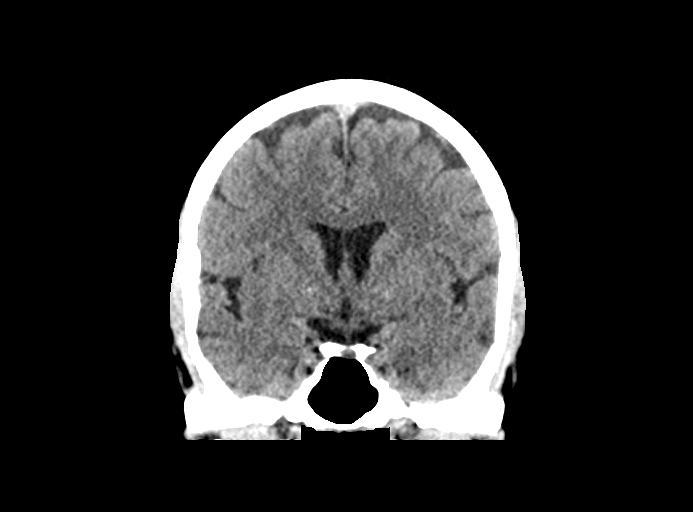
[im 37/67  brain]
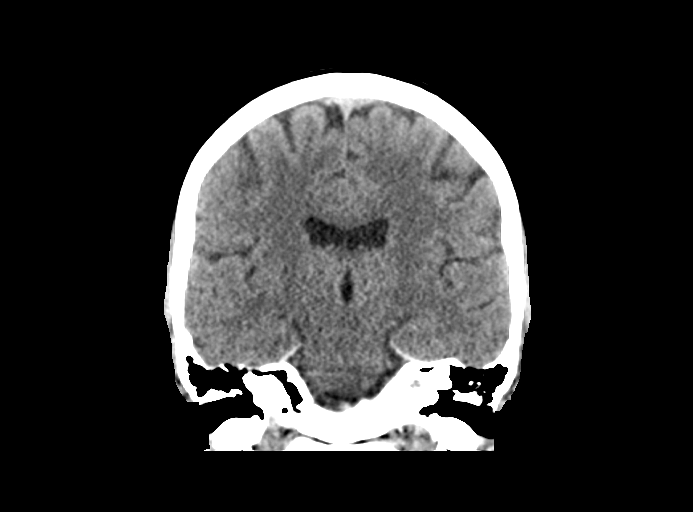

[Series 6: head without sag · sagittal · non-contrast · 0.36mm/px · 3 of 67 slices shown]
[im 23/67  brain]
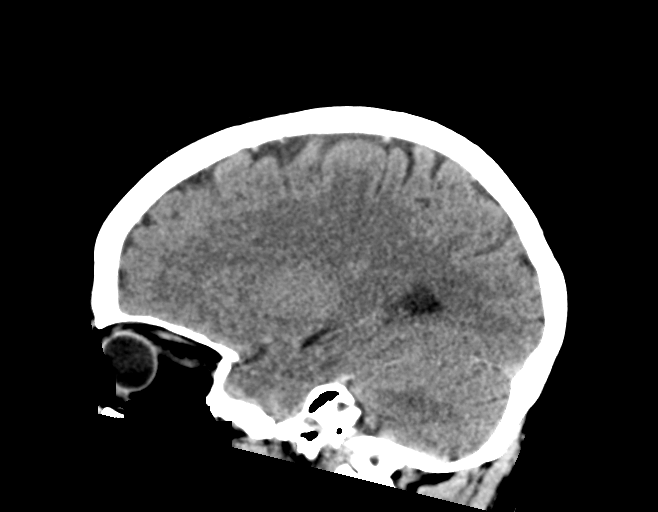
[im 34/67  brain]
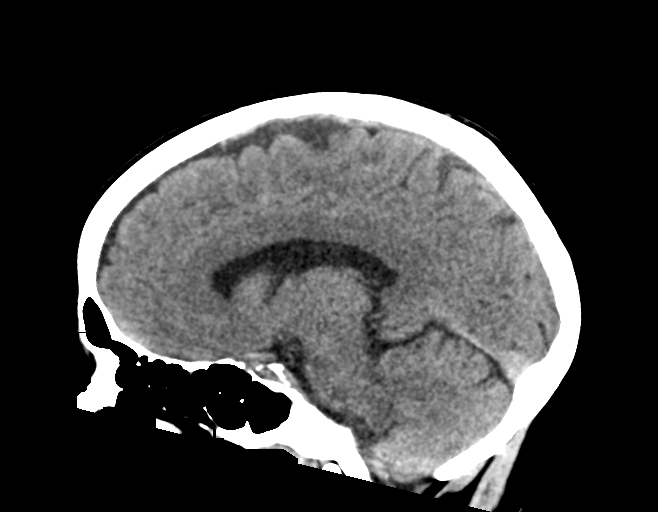
[im 45/67  brain]
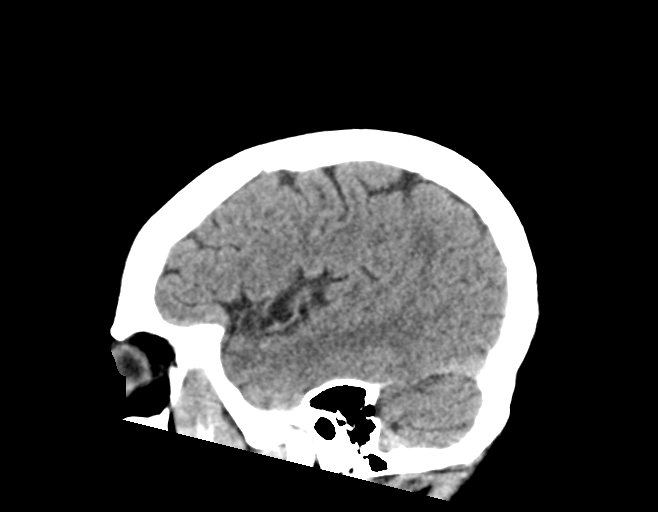

[16 of 47 positions shown; findings below may reference images not displayed]

FINDINGS: Brain: No acute territorial infarction, hemorrhage, or intracranial
mass. The ventricles are nonenlarged.

Vascular: No hyperdense vessel or unexpected calcification.

Skull: Normal. Negative for fracture or focal lesion.

Sinuses/Orbits: No acute finding.

Other: None
IMPRESSION: Negative non contrasted CT of the brain

## 2021-09-20 DIAGNOSIS — H26491 Other secondary cataract, right eye: Secondary | ICD-10-CM | POA: Diagnosis not present

## 2021-09-20 DIAGNOSIS — H43811 Vitreous degeneration, right eye: Secondary | ICD-10-CM | POA: Diagnosis not present

## 2021-09-20 DIAGNOSIS — H43391 Other vitreous opacities, right eye: Secondary | ICD-10-CM | POA: Diagnosis not present

## 2021-10-13 DIAGNOSIS — H43311 Vitreous membranes and strands, right eye: Secondary | ICD-10-CM | POA: Diagnosis not present

## 2021-10-13 DIAGNOSIS — H43391 Other vitreous opacities, right eye: Secondary | ICD-10-CM | POA: Diagnosis not present

## 2021-10-13 DIAGNOSIS — H26491 Other secondary cataract, right eye: Secondary | ICD-10-CM | POA: Diagnosis not present

## 2021-10-25 ENCOUNTER — Other Ambulatory Visit: Payer: Self-pay

## 2021-10-25 ENCOUNTER — Ambulatory Visit: Payer: BC Managed Care – PPO | Admitting: Cardiology

## 2021-10-25 ENCOUNTER — Encounter: Payer: Self-pay | Admitting: Cardiology

## 2021-10-25 VITALS — BP 135/78 | HR 54 | Resp 16 | Ht 67.0 in | Wt 153.4 lb

## 2021-10-25 DIAGNOSIS — R002 Palpitations: Secondary | ICD-10-CM | POA: Diagnosis not present

## 2021-10-25 DIAGNOSIS — I340 Nonrheumatic mitral (valve) insufficiency: Secondary | ICD-10-CM | POA: Diagnosis not present

## 2021-10-25 DIAGNOSIS — R072 Precordial pain: Secondary | ICD-10-CM | POA: Diagnosis not present

## 2021-10-25 DIAGNOSIS — I10 Essential (primary) hypertension: Secondary | ICD-10-CM

## 2021-10-25 NOTE — Progress Notes (Signed)
Date:  10/25/2021   ID:  [name], DOB 1961/09/10, MRN [id number]  PCP:  Shon Baton, MD  Cardiologist:  Rex Kras, DO, [facility] (established care 08/12/2020)  Date: 10/25/21 Last Office Visit: 05/10/2021   Chief Complaint  Patient presents with   Precordial chest pain   Follow-up    HPI  Jillian Rodriguez is a 60 y.o. female who presents to the office with a chief complaint of " chest pain re-evaluation and review test results."  Patient's past medical history and cardiovascular risk factors include: Fibromyalgia, hypothyroidism, hypertension, postmenopausal female.  She was originally referred to the office at the request of her PCP for evaluation and management of chest pain.  Since establishing care patient has undergone an extensive ischemic work-up including echocardiogram, coronary calcium score, and stress test.  Results reviewed with her during today's office visit and noted below for further reference.  In May 2022 she was seen by Lawerance Cruel, PA as part of a sick visit for chest pain and palpitations.  At that time she was reassured and no additional testing was warranted.  She now presents for regular follow-up visit.  Patient states that since that visit in May her symptoms of chest pain and palpitations have improved significantly.  She may have 1 episode once a month, substernally located, occurs usually at night, lasting for 1 minute or so, nonradiating, nonexertional.  No change in overall functional status.  No hospitalizations or urgent care visits since last office visit.  No family history of premature coronary disease or sudden cardiac death.  FUNCTIONAL STATUS: No structured exercise program or daily routine.   ALLERGIES: Allergies  Allergen Reactions   Penicillins Other (See Comments)    Did it involve swelling of the face/tongue/throat, SOB, or low BP? N/A Did it involve sudden or severe rash/hives, skin peeling, or any reaction on the inside of  your mouth or nose? N/A Did you need to seek medical attention at a hospital or doctor's office? N/A When did it last happen?  Child If all above answers are "NO", may proceed with cephalosporin use.      MEDICATION LIST PRIOR TO VISIT: Current Meds  Medication Sig   ALPRAZolam (XANAX) 0.25 MG tablet Take 0.25 mg by mouth daily as needed.   b complex vitamins tablet Take 1 tablet by mouth daily.   CALCIUM-MAGNESIUM-ZINC PO Take 1 tablet by mouth daily.   Cholecalciferol (VITAMIN D3 PO) Take by mouth. 3000 daily   DULoxetine (CYMBALTA) 60 MG capsule Take 90 mg by mouth daily.    estradiol (VIVELLE-DOT) 0.1 MG/24HR patch Place 1 patch onto the skin 2 (two) times a week.   Levocetirizine Dihydrochloride (XYZAL PO) Take by mouth.   Multiple Vitamin (MULTIVITAMIN WITH MINERALS) TABS tablet Take 1 tablet by mouth daily.   Omega-3 Fatty Acids (FISH OIL PO) Take 1 capsule by mouth daily.   oxybutynin (DITROPAN-XL) 10 MG 24 hr tablet Take 10 mg by mouth daily.   rOPINIRole (REQUIP) 0.25 MG tablet Take 0.25 mg by mouth at bedtime.      PAST MEDICAL HISTORY: Past Medical History:  Diagnosis Date   Allergy    Anxiety    Depression    Fibromyalgia    Gallstones    Hyperlipidemia    IBS (irritable bowel syndrome)     PAST SURGICAL HISTORY: Past Surgical History:  Procedure Laterality Date   CESAREAN SECTION     x 1   CHOLECYSTECTOMY     LAPAROSCOPY  endometriosis and uterine polyp removal   OVARIAN CYST REMOVAL     VAGINAL HYSTERECTOMY  04/2007   total    FAMILY HISTORY: The patient family history includes Atrial fibrillation in her mother; Breast cancer in her paternal grandmother; Colon polyps in her father; Heart disease in her father, maternal grandfather, mother, and paternal uncle; Hypertension in her father; Kidney disease in her father.  SOCIAL HISTORY:  The patient  reports that she has never smoked. She has never used smokeless tobacco. She reports current alcohol  use. She reports that she does not use drugs.  REVIEW OF SYSTEMS: Review of Systems  Constitutional: Negative for chills and fever.  HENT:  Negative for hoarse voice and nosebleeds.   Eyes:  Negative for discharge, double vision and pain.  Cardiovascular:  Positive for chest pain (improved). Negative for claudication, dyspnea on exertion, leg swelling, near-syncope, orthopnea, palpitations, paroxysmal nocturnal dyspnea and syncope.  Respiratory:  Negative for hemoptysis and shortness of breath.   Musculoskeletal:  Negative for muscle cramps and myalgias.  Gastrointestinal:  Negative for abdominal pain, constipation, diarrhea, hematemesis, hematochezia, melena, nausea and vomiting.  Neurological:  Negative for dizziness and light-headedness.   PHYSICAL EXAM: Vitals with BMI 10/25/2021 05/10/2021 09/27/2020  Height 5' 7"  5' 7"  5' 7"   Weight 153 lbs 6 oz 151 lbs 151 lbs  BMI 24.02 67.12 45.80  Systolic 998 338 250  Diastolic 78 83 77  Pulse 54 68 59    CONSTITUTIONAL: Well-developed and well-nourished. No acute distress.  SKIN: Skin is warm and dry. No rash noted. No cyanosis. No pallor. No jaundice HEAD: Normocephalic and atraumatic.  EYES: No scleral icterus MOUTH/THROAT: Moist oral membranes.  NECK: No JVD present. No thyromegaly noted. No carotid bruits  LYMPHATIC: No visible cervical adenopathy.  CHEST Normal respiratory effort. No intercostal retractions  LUNGS: Clear to auscultation bilaterally.  No stridor. No wheezes. No rales.  CARDIOVASCULAR: Regular rate and rhythm, positive S1-S2, no murmurs rubs or gallops appreciated. ABDOMINAL: No apparent ascites.  EXTREMITIES: No peripheral edema  HEMATOLOGIC: No significant bruising NEUROLOGIC: Oriented to person, place, and time. Nonfocal. Normal muscle tone.  PSYCHIATRIC: Normal mood and affect. Normal behavior. Cooperative  CARDIAC DATABASE: EKG: 10/25/2021: Sinus bradycardia, 58 bpm, normal axis, without underlying ischemia  injury pattern.   Echocardiogram: 08/26/2020:  1. Normal LV systolic function with visual EF 60-65%. Left ventricle cavity is normal in size. Mild to moderate left ventricular hypertrophy. Normal global wall motion. Indeterminate diastolic filling pattern, normal LAP.  2. Left atrial cavity is mildly dilated.  3. Mild (Grade I) mitral regurgitation.  4. Mild tricuspid regurgitation.  5. No prior study for comparison  Stress Testing: Exercise Myoview stress test 08/18/2020:  Exercise nuclear stress test was performed using Bruce protocol. Patient reached 13.4 METS, and 87% of age predicted maximum heart rate. Exercise capacity was excellent. Chest pain not reported. Heart rate and hemodynamic response were normak. Stress EKG revealed no ischemic changes.  Normal myocardial perfusion. Stress LVEF 70%.  Low risk study.   Coronary calcium scoring study 08/18/2020 at [facility]:  Total CAC score: 0   Heart Catheterization: None   LABORATORY DATA:  External Labs: Collected: 07/28/2020 Creatinine 0.79 mg/dL. eGFR: 83 mL/min per 1.73 m Lipid profile: Total cholesterol 210, triglycerides 121, HDL 69, LDL 118, non-HDL 141 TSH: 5.53  IMPRESSION:    ICD-10-CM   1. Precordial pain  R07.2 EKG 12-Lead    2. Palpitations  R00.2     3. Benign hypertension  I10  4. Nonrheumatic mitral valve regurgitation  I34.0        RECOMMENDATIONS: Jillian Rodriguez is a 60 y.o. female whose past medical history and cardiac risk factors include: Fibromyalgia, hypothyroidism, hypertension, postmenopausal female.  From a cardiovascular standpoint patient is doing well.  Her palpitations have resolved.  And her precordial pain seldomly occurs once a month and appears to be noncardiac based on her symptomatology.  She has undergone an extensive ischemic work-up including exercise nuclear stress test, echocardiogram, and coronary calcium score.  The shared decision is to continue monitoring her symptoms and  if she notices change in her overall physical endurance, increase in intensity, frequency, and/or duration of her symptoms, or has exertional chest pain she is asked to call the office for reevaluation or go to the ER for further evaluation and management.  She is educated on the importance of improving her modifiable cardiovascular risk factors.  She was noted to have mild mitral and tricuspid regurgitation on surface echocardiogram.  She is asymptomatic.  Would recommend an echocardiogram in 3 years, approximately September 2020 for for reevaluation of mitral regurgitation.  Sooner if change in clinical status.  These recommendations were discussed with the patient and she is agreeable with the plan of care.  Would like to see her on an annual basis or sooner if change in clinical condition.  FINAL MEDICATION LIST END OF ENCOUNTER: No orders of the defined types were placed in this encounter.    Current Outpatient Medications:    ALPRAZolam (XANAX) 0.25 MG tablet, Take 0.25 mg by mouth daily as needed., Disp: , Rfl:    b complex vitamins tablet, Take 1 tablet by mouth daily., Disp: , Rfl:    CALCIUM-MAGNESIUM-ZINC PO, Take 1 tablet by mouth daily., Disp: , Rfl:    Cholecalciferol (VITAMIN D3 PO), Take by mouth. 3000 daily, Disp: , Rfl:    DULoxetine (CYMBALTA) 60 MG capsule, Take 90 mg by mouth daily. , Disp: , Rfl:    estradiol (VIVELLE-DOT) 0.1 MG/24HR patch, Place 1 patch onto the skin 2 (two) times a week., Disp: , Rfl:    Levocetirizine Dihydrochloride (XYZAL PO), Take by mouth., Disp: , Rfl:    Multiple Vitamin (MULTIVITAMIN WITH MINERALS) TABS tablet, Take 1 tablet by mouth daily., Disp: , Rfl:    Omega-3 Fatty Acids (FISH OIL PO), Take 1 capsule by mouth daily., Disp: , Rfl:    oxybutynin (DITROPAN-XL) 10 MG 24 hr tablet, Take 10 mg by mouth daily., Disp: , Rfl:    rOPINIRole (REQUIP) 0.25 MG tablet, Take 0.25 mg by mouth at bedtime. , Disp: , Rfl:   Orders Placed This Encounter   Procedures   EKG 12-Lead   There are no Patient Instructions on file for this visit.   --Continue cardiac medications as reconciled in final medication list. --Return in about 1 year (around 10/25/2022). Or sooner if needed. --Continue follow-up with your primary care physician regarding the management of your other chronic comorbid conditions.  Patient's questions and concerns were addressed to her satisfaction. She voices understanding of the instructions provided during this encounter.   This note was created using a voice recognition software as a result there may be grammatical errors inadvertently enclosed that do not reflect the nature of this encounter. Every attempt is made to correct such errors.  Total time spent: 32 minutes    Olen Eaves, PA-C 10/25/2021, 10:28 AM Office: [phone]

## 2021-11-02 ENCOUNTER — Ambulatory Visit: Payer: BC Managed Care – PPO | Admitting: Cardiology

## 2021-12-13 DIAGNOSIS — H26492 Other secondary cataract, left eye: Secondary | ICD-10-CM | POA: Diagnosis not present

## 2021-12-13 DIAGNOSIS — Z961 Presence of intraocular lens: Secondary | ICD-10-CM | POA: Diagnosis not present

## 2021-12-13 DIAGNOSIS — H18593 Other hereditary corneal dystrophies, bilateral: Secondary | ICD-10-CM | POA: Diagnosis not present

## 2021-12-13 DIAGNOSIS — H18413 Arcus senilis, bilateral: Secondary | ICD-10-CM | POA: Diagnosis not present

## 2022-02-28 DIAGNOSIS — Z1239 Encounter for other screening for malignant neoplasm of breast: Secondary | ICD-10-CM | POA: Diagnosis not present

## 2022-02-28 DIAGNOSIS — Z1231 Encounter for screening mammogram for malignant neoplasm of breast: Secondary | ICD-10-CM | POA: Diagnosis not present

## 2022-02-28 DIAGNOSIS — N762 Acute vulvitis: Secondary | ICD-10-CM | POA: Diagnosis not present

## 2022-02-28 DIAGNOSIS — Z01419 Encounter for gynecological examination (general) (routine) without abnormal findings: Secondary | ICD-10-CM | POA: Diagnosis not present

## 2022-03-01 DIAGNOSIS — I10 Essential (primary) hypertension: Secondary | ICD-10-CM | POA: Diagnosis not present

## 2022-03-01 DIAGNOSIS — R946 Abnormal results of thyroid function studies: Secondary | ICD-10-CM | POA: Diagnosis not present

## 2022-03-01 DIAGNOSIS — M859 Disorder of bone density and structure, unspecified: Secondary | ICD-10-CM | POA: Diagnosis not present

## 2022-03-02 DIAGNOSIS — I10 Essential (primary) hypertension: Secondary | ICD-10-CM | POA: Diagnosis not present

## 2022-03-02 DIAGNOSIS — Z1212 Encounter for screening for malignant neoplasm of rectum: Secondary | ICD-10-CM | POA: Diagnosis not present

## 2022-03-02 DIAGNOSIS — Z0000 Encounter for general adult medical examination without abnormal findings: Secondary | ICD-10-CM | POA: Diagnosis not present

## 2022-03-02 DIAGNOSIS — R82998 Other abnormal findings in urine: Secondary | ICD-10-CM | POA: Diagnosis not present

## 2022-03-14 DIAGNOSIS — H35372 Puckering of macula, left eye: Secondary | ICD-10-CM | POA: Diagnosis not present

## 2022-03-14 DIAGNOSIS — G43101 Migraine with aura, not intractable, with status migrainosus: Secondary | ICD-10-CM | POA: Diagnosis not present

## 2022-03-14 DIAGNOSIS — H43393 Other vitreous opacities, bilateral: Secondary | ICD-10-CM | POA: Diagnosis not present

## 2022-04-19 DIAGNOSIS — H3581 Retinal edema: Secondary | ICD-10-CM | POA: Diagnosis not present

## 2022-04-26 DIAGNOSIS — F32A Depression, unspecified: Secondary | ICD-10-CM | POA: Diagnosis not present

## 2022-04-26 DIAGNOSIS — R413 Other amnesia: Secondary | ICD-10-CM | POA: Diagnosis not present

## 2022-05-03 DIAGNOSIS — R413 Other amnesia: Secondary | ICD-10-CM | POA: Diagnosis not present

## 2022-05-12 DIAGNOSIS — R419 Unspecified symptoms and signs involving cognitive functions and awareness: Secondary | ICD-10-CM | POA: Diagnosis not present

## 2022-05-12 DIAGNOSIS — F419 Anxiety disorder, unspecified: Secondary | ICD-10-CM | POA: Diagnosis not present

## 2022-05-12 DIAGNOSIS — F32A Depression, unspecified: Secondary | ICD-10-CM | POA: Diagnosis not present

## 2022-05-15 DIAGNOSIS — K219 Gastro-esophageal reflux disease without esophagitis: Secondary | ICD-10-CM | POA: Diagnosis not present

## 2022-05-15 DIAGNOSIS — J3489 Other specified disorders of nose and nasal sinuses: Secondary | ICD-10-CM | POA: Diagnosis not present

## 2022-05-15 DIAGNOSIS — R1310 Dysphagia, unspecified: Secondary | ICD-10-CM | POA: Diagnosis not present

## 2022-05-15 DIAGNOSIS — J343 Hypertrophy of nasal turbinates: Secondary | ICD-10-CM | POA: Diagnosis not present

## 2022-05-16 DIAGNOSIS — R419 Unspecified symptoms and signs involving cognitive functions and awareness: Secondary | ICD-10-CM | POA: Diagnosis not present

## 2022-05-16 DIAGNOSIS — F419 Anxiety disorder, unspecified: Secondary | ICD-10-CM | POA: Diagnosis not present

## 2022-05-16 DIAGNOSIS — F32A Depression, unspecified: Secondary | ICD-10-CM | POA: Diagnosis not present

## 2022-06-23 ENCOUNTER — Telehealth: Payer: Self-pay

## 2022-06-23 DIAGNOSIS — L03116 Cellulitis of left lower limb: Secondary | ICD-10-CM | POA: Diagnosis not present

## 2022-06-23 DIAGNOSIS — R0789 Other chest pain: Secondary | ICD-10-CM | POA: Diagnosis not present

## 2022-06-23 NOTE — Telephone Encounter (Signed)
Patient called stating that she had chest pain and moved to her right side of her chest and then under her right arm and breast.  Patient went to urgent care for pain in her toe, and the did an EKG and told her her HR was 44. Patient was transferred to front desk staff [facility]) to schedule appointment. Patient is aware to go to ER if symptoms worsen.

## 2022-06-28 ENCOUNTER — Inpatient Hospital Stay: Payer: BC Managed Care – PPO

## 2022-06-28 ENCOUNTER — Ambulatory Visit: Payer: BC Managed Care – PPO | Admitting: Cardiology

## 2022-06-28 ENCOUNTER — Encounter: Payer: Self-pay | Admitting: Cardiology

## 2022-06-28 VITALS — BP 123/75 | HR 63 | Temp 97.9°F | Resp 16 | Ht 67.0 in | Wt 154.2 lb

## 2022-06-28 DIAGNOSIS — R001 Bradycardia, unspecified: Secondary | ICD-10-CM

## 2022-06-28 DIAGNOSIS — R072 Precordial pain: Secondary | ICD-10-CM

## 2022-06-28 DIAGNOSIS — I10 Essential (primary) hypertension: Secondary | ICD-10-CM | POA: Diagnosis not present

## 2022-06-28 DIAGNOSIS — I340 Nonrheumatic mitral (valve) insufficiency: Secondary | ICD-10-CM | POA: Diagnosis not present

## 2022-06-28 NOTE — Progress Notes (Addendum)
Date:  06/28/2022   ID:  [name], DOB 1961/01/30, MRN [id number]  PCP:  Shon Baton, MD  Cardiologist:  Rex Kras, DO, [facility] (established care 08/12/2020)  Date: 06/28/22 Last Office Visit: 10/25/2021  Chief Complaint  Patient presents with   Chest Pain    Slow heart rate   Follow-up    HPI  Jillian Rodriguez is a 61 y.o. female whose past medical history and cardiovascular risk factors include: Fibromyalgia, hypothyroidism, hypertension, postmenopausal female.  Last week and got to urgent care for 2 issues and was diagnosed with infection/gout.  Incidentally was noted to have a low pulse and had an EKG which confirmed sinus bradycardia upon further questioning patient did endorse chest pain and now is referred to cardiology for further evaluation and management.  Patient states that 2 days prior to going to urgent care and she woke up in the middle the night, experiencing substernal and right-sided discomfort, lasted for about 2 minutes, self-limited, intensity was 8 out of 10, nonradiating, nonexertional.  She does have some residual discomfort at times which she describes to be 3 out of 10 in intensity.  No change in overall physical endurance.  Patient denies near-syncope or syncopal events.  No family history of premature coronary disease or sudden cardiac death.  FUNCTIONAL STATUS: No structured exercise program or daily routine.   ALLERGIES: Allergies  Allergen Reactions   Penicillins Other (See Comments)    Did it involve swelling of the face/tongue/throat, SOB, or low BP? N/A Did it involve sudden or severe rash/hives, skin peeling, or any reaction on the inside of your mouth or nose? N/A Did you need to seek medical attention at a hospital or doctor's office? N/A When did it last happen?  Child If all above answers are "NO", may proceed with cephalosporin use.      MEDICATION LIST PRIOR TO VISIT: Current Meds  Medication Sig   ALPRAZolam (XANAX) 0.25 MG  tablet Take 0.25 mg by mouth daily as needed.   b complex vitamins tablet Take 1 tablet by mouth daily.   CALCIUM-MAGNESIUM-ZINC PO Take 1 tablet by mouth daily.   cephALEXin (KEFLEX) 500 MG capsule Take 500 mg by mouth 3 (three) times daily.   Cholecalciferol (VITAMIN D3 PO) Take by mouth. 3000 daily   estradiol (VIVELLE-DOT) 0.1 MG/24HR patch Place 1 patch onto the skin 2 (two) times a week.   Levocetirizine Dihydrochloride (XYZAL PO) Take by mouth.   Multiple Vitamin (MULTIVITAMIN WITH MINERALS) TABS tablet Take 1 tablet by mouth daily.   Omega-3 Fatty Acids (FISH OIL PO) Take 1 capsule by mouth daily.   omeprazole (PRILOSEC) 20 MG capsule Take 20 mg by mouth daily.   oxybutynin (DITROPAN-XL) 10 MG 24 hr tablet Take 10 mg by mouth daily.   rOPINIRole (REQUIP) 0.25 MG tablet Take 0.25 mg by mouth at bedtime.      PAST MEDICAL HISTORY: Past Medical History:  Diagnosis Date   Allergy    Anxiety    Depression    Fibromyalgia    Gallstones    Hyperlipidemia    IBS (irritable bowel syndrome)     PAST SURGICAL HISTORY: Past Surgical History:  Procedure Laterality Date   CESAREAN SECTION     x 1   CHOLECYSTECTOMY     LAPAROSCOPY     endometriosis and uterine polyp removal   OVARIAN CYST REMOVAL     VAGINAL HYSTERECTOMY  04/2007   total    FAMILY HISTORY: The patient  family history includes Atrial fibrillation in her mother; Breast cancer in her paternal grandmother; Colon polyps in her father; Heart disease in her father, maternal grandfather, mother, and paternal uncle; Hypertension in her father; Kidney disease in her father.  SOCIAL HISTORY:  The patient  reports that she has never smoked. She has never used smokeless tobacco. She reports current alcohol use. She reports that she does not use drugs.  REVIEW OF SYSTEMS: Review of Systems  Cardiovascular:  Positive for chest pain. Negative for dyspnea on exertion, leg swelling, orthopnea, palpitations, paroxysmal nocturnal  dyspnea and syncope.  Respiratory:  Negative for shortness of breath.   Hematologic/Lymphatic: Negative for bleeding problem.  Gastrointestinal:  Positive for heartburn.    PHYSICAL EXAM:    06/28/2022   11:38 AM 10/25/2021    9:58 AM 05/10/2021    8:43 AM  Vitals with BMI  Height 5' 7"  5' 7"  5' 7"   Weight 154 lbs 3 oz 153 lbs 6 oz 151 lbs  BMI 24.15 13.14 38.88  Systolic 757 972 820  Diastolic 75 78 83  Pulse 63 54 68    CONSTITUTIONAL: Well-developed and well-nourished. No acute distress.  SKIN: Skin is warm and dry. No rash noted. No cyanosis. No pallor. No jaundice HEAD: Normocephalic and atraumatic.  EYES: No scleral icterus MOUTH/THROAT: Moist oral membranes.  NECK: No JVD present. No thyromegaly noted. No carotid bruits  CHEST Normal respiratory effort. No intercostal retractions  LUNGS: Clear to auscultation bilaterally.  No stridor. No wheezes. No rales.  CARDIOVASCULAR: Regular rate and rhythm, positive S1-S2, no murmurs rubs or gallops appreciated. ABDOMINAL: Soft, nontender, nondistended, positive bowel sounds in all 4 quadrants, EXTREMITIES: No peripheral edema  HEMATOLOGIC: No significant bruising NEUROLOGIC: Oriented to person, place, and time. Nonfocal. Normal muscle tone.  PSYCHIATRIC: Normal mood and affect. Normal behavior. Cooperative  CARDIAC DATABASE: EKG: 10/25/2021: Sinus bradycardia, 58 bpm, normal axis, without underlying ischemia injury pattern.  06/28/2022: Sinus bradycardia, 46 bpm, without underlying injury pattern.  Echocardiogram: 08/26/2020:  1. Normal LV systolic function with visual EF 60-65%. Left ventricle cavity is normal in size. Mild to moderate left ventricular hypertrophy. Normal global wall motion. Indeterminate diastolic filling pattern, normal LAP.  2. Left atrial cavity is mildly dilated.  3. Mild (Grade I) mitral regurgitation.  4. Mild tricuspid regurgitation.  5. No prior study for comparison  Stress Testing: Exercise Myoview  stress test 08/18/2020:  Exercise nuclear stress test was performed using Bruce protocol. Patient reached 13.4 METS, and 87% of age predicted maximum heart rate. Exercise capacity was excellent. Chest pain not reported. Heart rate and hemodynamic response were normak. Stress EKG revealed no ischemic changes.  Normal myocardial perfusion. Stress LVEF 70%.  Low risk study.   Coronary calcium scoring study 08/18/2020 at [facility]:  Total CAC score: 0   Heart Catheterization: None   LABORATORY DATA:  External Labs: Collected: 07/28/2020 Creatinine 0.79 mg/dL. eGFR: 83 mL/min per 1.73 m Lipid profile: Total cholesterol 210, triglycerides 121, HDL 69, LDL 118, non-HDL 141 TSH: 5.53  Collected: 04/26/2022 [facility] available in [place]. TSH 3.92  IMPRESSION:    ICD-10-CM   1. Precordial pain  R07.2 EKG 12-Lead    PCV CARDIAC STRESS TEST    PCV ECHOCARDIOGRAM COMPLETE    2. Asymptomatic bradycardia  R00.1 LONG TERM MONITOR (3-14 DAYS)    3. Benign hypertension  I10     4. Nonrheumatic mitral valve regurgitation  I34.0        RECOMMENDATIONS: Jillian Rodriguez is a 61 y.o. female whose past medical history and cardiac risk factors include: Fibromyalgia, hypothyroidism, hypertension, postmenopausal female.  Precordial pain Likely noncardiac based on symptoms. Recently had a coronary calcium score, echo and exercise nuclear stress test in 2021.  Results reviewed with her and her husband in great detail. We will repeat a exercise treadmill stress test to evaluate for exercise-induced ischemia and chronotropic competence. Plan echocardiogram to evaluate for LVEF and regional wall motion abnormalities. EKG today shows sinus bradycardia without underlying injury pattern.  Asymptomatic bradycardia Resting heart rate is usually in the 40 bpm. Had the patient walk 4 laps and a heart rate goes from 46 bpm at rest to 78 bpm. During her last exercise stress test she did achieve 87% of  age-predicted maximum heart rate. We will order a 3-day extended Holter monitor to evaluate for dysrhythmias. Most recent TSH levels are within normal limits as of May 2023. She also had labs with PCP which she will bring in at the next office visit. Currently not on AV nodal blocking agents. Urgent care EKG independently reviewed and uploaded in the media section. Patient is made aware of symptoms that are associated with symptomatic bradycardia and if present she should seek medical attention.  Benign hypertension Office blood pressures are well controlled. Medications reconciled.  Nonrheumatic mitral valve regurgitation Asymptomatic. We will continue to monitor   FINAL MEDICATION LIST END OF ENCOUNTER: No orders of the defined types were placed in this encounter.    Current Outpatient Medications:    ALPRAZolam (XANAX) 0.25 MG tablet, Take 0.25 mg by mouth daily as needed., Disp: , Rfl:    b complex vitamins tablet, Take 1 tablet by mouth daily., Disp: , Rfl:    CALCIUM-MAGNESIUM-ZINC PO, Take 1 tablet by mouth daily., Disp: , Rfl:    cephALEXin (KEFLEX) 500 MG capsule, Take 500 mg by mouth 3 (three) times daily., Disp: , Rfl:    Cholecalciferol (VITAMIN D3 PO), Take by mouth. 3000 daily, Disp: , Rfl:    estradiol (VIVELLE-DOT) 0.1 MG/24HR patch, Place 1 patch onto the skin 2 (two) times a week., Disp: , Rfl:    Levocetirizine Dihydrochloride (XYZAL PO), Take by mouth., Disp: , Rfl:    Multiple Vitamin (MULTIVITAMIN WITH MINERALS) TABS tablet, Take 1 tablet by mouth daily., Disp: , Rfl:    Omega-3 Fatty Acids (FISH OIL PO), Take 1 capsule by mouth daily., Disp: , Rfl:    omeprazole (PRILOSEC) 20 MG capsule, Take 20 mg by mouth daily., Disp: , Rfl:    oxybutynin (DITROPAN-XL) 10 MG 24 hr tablet, Take 10 mg by mouth daily., Disp: , Rfl:    rOPINIRole (REQUIP) 0.25 MG tablet, Take 0.25 mg by mouth at bedtime. , Disp: , Rfl:   Orders Placed This Encounter  Procedures   PCV CARDIAC  STRESS TEST   LONG TERM MONITOR (3-14 DAYS)   EKG 12-Lead   PCV ECHOCARDIOGRAM COMPLETE   There are no Patient Instructions on file for this visit.   --Continue cardiac medications as reconciled in final medication list. --Return in about 4 weeks (around 07/26/2022) for Follow up, Chest pain, Review test results. Or sooner if needed. --Continue follow-up with your primary care physician regarding the management of your other chronic comorbid conditions.  Patient's questions and concerns were addressed to her satisfaction. She voices understanding of the instructions provided during this encounter.   This note was created using a voice recognition software as a result there may be grammatical errors  inadvertently enclosed that do not reflect the nature of this encounter. Every attempt is made to correct such errors.   Alexzander Dolinger [place], Hershal Coria 06/28/2022, 12:54 PM Office: [phone]   ADDENDUM: External Labs: Collected: 03/01/2022 provided by referring physician Total cholesterol 176, triglycerides 81, HDL 57, LDL 103, non-HDL 119. BUN 14, creatinine 0.8. eGFR 73. Sodium 138, potassium 4.6, chloride 103, bicarb 28, AST 23, ALT 20, alkaline phosphatase 66. Hemoglobin 13.8. Hematocrit 38.4. TSH 3.72  Mechele Claude [facility]  Pager: [phone] Office: [phone] 07/16/22 11:46 PM

## 2022-07-06 ENCOUNTER — Ambulatory Visit: Payer: BC Managed Care – PPO

## 2022-07-06 ENCOUNTER — Encounter: Payer: Self-pay | Admitting: Internal Medicine

## 2022-07-06 DIAGNOSIS — R072 Precordial pain: Secondary | ICD-10-CM

## 2022-07-13 DIAGNOSIS — R001 Bradycardia, unspecified: Secondary | ICD-10-CM | POA: Diagnosis not present

## 2022-07-16 DIAGNOSIS — R001 Bradycardia, unspecified: Secondary | ICD-10-CM | POA: Diagnosis not present

## 2022-08-09 ENCOUNTER — Ambulatory Visit: Payer: BC Managed Care – PPO

## 2022-08-09 DIAGNOSIS — R072 Precordial pain: Secondary | ICD-10-CM

## 2022-08-11 ENCOUNTER — Other Ambulatory Visit: Payer: BC Managed Care – PPO

## 2022-08-15 ENCOUNTER — Ambulatory Visit: Payer: BC Managed Care – PPO | Admitting: Internal Medicine

## 2022-08-17 ENCOUNTER — Encounter: Payer: Self-pay | Admitting: Cardiology

## 2022-08-17 ENCOUNTER — Ambulatory Visit: Payer: BC Managed Care – PPO | Admitting: Cardiology

## 2022-08-17 VITALS — BP 123/72 | HR 43 | Temp 97.2°F | Resp 16 | Ht 67.0 in | Wt 156.8 lb

## 2022-08-17 DIAGNOSIS — I340 Nonrheumatic mitral (valve) insufficiency: Secondary | ICD-10-CM | POA: Diagnosis not present

## 2022-08-17 DIAGNOSIS — R001 Bradycardia, unspecified: Secondary | ICD-10-CM

## 2022-08-17 DIAGNOSIS — I10 Essential (primary) hypertension: Secondary | ICD-10-CM

## 2022-08-17 NOTE — Progress Notes (Signed)
Date:  08/17/2022   ID:  Jillian Rodriguez, DOB [birth year], MRN [id number]  PCP:  Shon Baton, MD  Cardiologist:  Rex Kras, DO, [facility] (established care 08/12/2020)  Date: 08/17/22 Last Office Visit: 06/28/2022  Chief Complaint  Patient presents with   Results   Follow-up    HPI  Jillian Rodriguez is a 61 y.o. female whose past medical history and cardiovascular risk factors include: Fibromyalgia, hypothyroidism, hypertension, postmenopausal female.  Patient was referred to the practice for evaluation of bradycardia.  Since last office visit she has undergone an exercise treadmill stress test which has illustrated good chronotropic competence and stress ECG is low risk for ischemia.  Echocardiogram notes preserved LVEF.  And cardiac monitor notes no significant dysrhythmias.  Patient denies near-syncope or syncopal events.  No hospitalizations or urgent care visits in the last office encounter.  FUNCTIONAL STATUS: No structured exercise program or daily routine.   ALLERGIES: Allergies  Allergen Reactions   Penicillins Other (See Comments)    Did it involve swelling of the face/tongue/throat, SOB, or low BP? N/A Did it involve sudden or severe rash/hives, skin peeling, or any reaction on the inside of your mouth or nose? N/A Did you need to seek medical attention at a hospital or doctor's office? N/A When did it last happen?  Child If all above answers are "NO", may proceed with cephalosporin use.      MEDICATION LIST PRIOR TO VISIT: Current Meds  Medication Sig   ALPRAZolam (XANAX) 0.25 MG tablet Take 0.25 mg by mouth daily as needed.   b complex vitamins tablet Take 1 tablet by mouth daily.   CALCIUM-MAGNESIUM-ZINC PO Take 1 tablet by mouth daily.   Cholecalciferol (VITAMIN D3 PO) Take by mouth. 3000 daily   estradiol (VIVELLE-DOT) 0.1 MG/24HR patch Place 1 patch onto the skin 2 (two) times a week.   Levocetirizine Dihydrochloride (XYZAL PO) Take by mouth.   Multiple  Vitamin (MULTIVITAMIN WITH MINERALS) TABS tablet Take 1 tablet by mouth daily.   Omega-3 Fatty Acids (FISH OIL PO) Take 1 capsule by mouth daily.   omeprazole (PRILOSEC) 20 MG capsule Take 20 mg by mouth daily.   oxybutynin (DITROPAN-XL) 10 MG 24 hr tablet Take 10 mg by mouth daily.   rOPINIRole (REQUIP) 0.25 MG tablet Take 0.25 mg by mouth at bedtime.      PAST MEDICAL HISTORY: Past Medical History:  Diagnosis Date   Allergy    Anxiety    Depression    Fibromyalgia    Gallstones    Hyperlipidemia    IBS (irritable bowel syndrome)     PAST SURGICAL HISTORY: Past Surgical History:  Procedure Laterality Date   CESAREAN SECTION     x 1   CHOLECYSTECTOMY     LAPAROSCOPY     endometriosis and uterine polyp removal   OVARIAN CYST REMOVAL     VAGINAL HYSTERECTOMY  04/2007   total    FAMILY HISTORY: The patient family history includes Atrial fibrillation in her mother; Breast cancer in her paternal grandmother; Colon polyps in her father; Heart disease in her father, maternal grandfather, mother, and paternal uncle; Hypertension in her father; Kidney disease in her father.  SOCIAL HISTORY:  The patient  reports that she has never smoked. She has never used smokeless tobacco. She reports current alcohol use. She reports that she does not use drugs.  REVIEW OF SYSTEMS: Review of Systems  Cardiovascular:  Negative for chest pain, dyspnea on exertion, leg swelling, orthopnea,  palpitations, paroxysmal nocturnal dyspnea and syncope.  Respiratory:  Negative for shortness of breath.   Hematologic/Lymphatic: Negative for bleeding problem.    PHYSICAL EXAM:    08/17/2022   11:45 AM 06/28/2022   11:38 AM 10/25/2021    9:58 AM  Vitals with BMI  Height 5' 7"  5' 7"  5' 7"   Weight 156 lbs 13 oz 154 lbs 3 oz 153 lbs 6 oz  BMI 24.55 45.40 98.11  Systolic 914 782 956  Diastolic 72 75 78  Pulse 43 63 54    CONSTITUTIONAL: Well-developed and well-nourished. No acute distress.  SKIN: Skin  is warm and dry. No rash noted. No cyanosis. No pallor. No jaundice HEAD: Normocephalic and atraumatic.  EYES: No scleral icterus MOUTH/THROAT: Moist oral membranes.  NECK: No JVD present. No thyromegaly noted. No carotid bruits  CHEST Normal respiratory effort. No intercostal retractions  LUNGS: Clear to auscultation bilaterally.  No stridor. No wheezes. No rales.  CARDIOVASCULAR: Regular rate and rhythm, positive S1-S2, no murmurs rubs or gallops appreciated. ABDOMINAL: Soft, nontender, nondistended, positive bowel sounds in all 4 quadrants, EXTREMITIES: No peripheral edema  HEMATOLOGIC: No significant bruising NEUROLOGIC: Oriented to person, place, and time. Nonfocal. Normal muscle tone.  PSYCHIATRIC: Normal mood and affect. Normal behavior. Cooperative  CARDIAC DATABASE: EKG: 10/25/2021: Sinus bradycardia, 58 bpm, normal axis, without underlying ischemia injury pattern.  06/28/2022: Sinus bradycardia, 46 bpm, without underlying injury pattern. 08/17/2022: Sinus bradycardia, 44 bpm, without underlying injury pattern  Echocardiogram: 08/09/2022:  Normal LV systolic function with visual EF 60-65%. Left ventricle cavity is normal in size. Mild concentric hypertrophy of the left ventricle. Normal global wall motion. Normal diastolic filling pattern, normal LAP.  Left atrial cavity is normal in size. A lipomatous septum is present.  Structurally normal mitral valve.  Mild to moderate mitral regurgitation.  Structurally normal tricuspid valve with no regurgitation. No evidence of pulmonary hypertension.   Stress Testing: Exercise Myoview stress test 08/18/2020:  Exercise nuclear stress test was performed using Bruce protocol. Patient reached 13.4 METS, and 87% of age predicted maximum heart rate. Exercise capacity was excellent. Chest pain not reported. Heart rate and hemodynamic response were normak. Stress EKG revealed no ischemic changes.  Normal myocardial perfusion. Stress LVEF 70%.  Low  risk study.   Exercise treadmill stress test 07/06/2022: Exercise treadmill stress test performed using Bruce protocol.  Patient reached 10.1 METS, and 88% of age predicted maximum heart rate.  Exercise capacity was excellent.  No chest pain reported.  Normal heart rate and hemodynamic response. Stress EKG revealed no ischemic changes. Low risk study.  Coronary calcium scoring study 08/18/2020 at [facility]:  Total CAC score: 0   Heart Catheterization: None   Cardiac monitor (Zio Patch): June 28, 2022-July 01, 2022 Dominant rhythm sinus, followed by bradycardia (burden 13%). Heart rate 40-139 bpm.  Avg HR 64 bpm. Minimum heart rate 40 bpm, on 06/29/2022, at 5:42 AM. No atrial fibrillation, ventricular tachycardia, high grade AV block, pauses (3 seconds or longer). Total ventricular ectopic burden <1%. Total supraventricular ectopic burden <1%. Patient triggered events: 0.    LABORATORY DATA:  External Labs: Collected: 03/01/2022 provided by referring physician Total cholesterol 176, triglycerides 81, HDL 57, LDL 103, non-HDL 119. BUN 14, creatinine 0.8. eGFR 73. Sodium 138, potassium 4.6, chloride 103, bicarb 28, AST 23, ALT 20, alkaline phosphatase 66. Hemoglobin 13.8. Hematocrit 38.4. TSH 3.72  IMPRESSION:    ICD-10-CM   1. Asymptomatic bradycardia  R00.1 EKG 12-Lead    2. Benign hypertension  I10     3. Nonrheumatic mitral valve regurgitation  I34.0        RECOMMENDATIONS: CAYLEIGH PAULL is a 61 y.o. female whose past medical history and cardiac risk factors include: Fibromyalgia, hypothyroidism, hypertension, postmenopausal female.  Asymptomatic bradycardia GXT illustrates of low risk stress test and good chronotropic competence. Cardiac monitor illustrates no significant dysrhythmia.  She has good heart rate variability. Echocardiogram notes preserved LVEF without any significant valvular heart disease. Would recommend continue medical therapy. Patient is asked to seek  medical attention if she has episodes of near syncope or syncope. Not on any AV nodal blocking agents. No identifiable reversible cause. Monitor for now  Plan of care discussed in great detail with both the patient and her husband at today's office visit.  Both of their questions and concerns were addressed to their satisfaction.  Would like to see her back on an annual basis or sooner if change in clinical status.  FINAL MEDICATION LIST END OF ENCOUNTER: No orders of the defined types were placed in this encounter.    Current Outpatient Medications:    ALPRAZolam (XANAX) 0.25 MG tablet, Take 0.25 mg by mouth daily as needed., Disp: , Rfl:    b complex vitamins tablet, Take 1 tablet by mouth daily., Disp: , Rfl:    CALCIUM-MAGNESIUM-ZINC PO, Take 1 tablet by mouth daily., Disp: , Rfl:    Cholecalciferol (VITAMIN D3 PO), Take by mouth. 3000 daily, Disp: , Rfl:    estradiol (VIVELLE-DOT) 0.1 MG/24HR patch, Place 1 patch onto the skin 2 (two) times a week., Disp: , Rfl:    Levocetirizine Dihydrochloride (XYZAL PO), Take by mouth., Disp: , Rfl:    Multiple Vitamin (MULTIVITAMIN WITH MINERALS) TABS tablet, Take 1 tablet by mouth daily., Disp: , Rfl:    Omega-3 Fatty Acids (FISH OIL PO), Take 1 capsule by mouth daily., Disp: , Rfl:    omeprazole (PRILOSEC) 20 MG capsule, Take 20 mg by mouth daily., Disp: , Rfl:    oxybutynin (DITROPAN-XL) 10 MG 24 hr tablet, Take 10 mg by mouth daily., Disp: , Rfl:    rOPINIRole (REQUIP) 0.25 MG tablet, Take 0.25 mg by mouth at bedtime. , Disp: , Rfl:   Orders Placed This Encounter  Procedures   EKG 12-Lead   There are no Patient Instructions on file for this visit.   --Continue cardiac medications as reconciled in final medication list. --Return in about 1 year (around 08/18/2023) for Annual follow up visit. Or sooner if needed. --Continue follow-up with your primary care physician regarding the management of your other chronic comorbid  conditions.  Patient's questions and concerns were addressed to her satisfaction. She voices understanding of the instructions provided during this encounter.   This note was created using a voice recognition software as a result there may be grammatical errors inadvertently enclosed that do not reflect the nature of this encounter. Every attempt is made to correct such errors.   Zaiya Annunziato [place], Hershal Coria 08/17/2022, 1:24 PM Office: [phone]

## 2022-08-22 ENCOUNTER — Ambulatory Visit: Payer: BC Managed Care – PPO | Admitting: Internal Medicine

## 2022-08-22 ENCOUNTER — Encounter: Payer: Self-pay | Admitting: Internal Medicine

## 2022-08-22 VITALS — BP 122/82 | HR 75 | Ht 67.0 in | Wt 156.0 lb

## 2022-08-22 DIAGNOSIS — R1319 Other dysphagia: Secondary | ICD-10-CM | POA: Diagnosis not present

## 2022-08-22 DIAGNOSIS — K219 Gastro-esophageal reflux disease without esophagitis: Secondary | ICD-10-CM | POA: Diagnosis not present

## 2022-08-22 NOTE — Patient Instructions (Signed)
Take omeprazole 2 capsules by mouth daily.   You have been scheduled for an endoscopy. Please follow written instructions given to you at your visit today. If you use inhalers (even only as needed), please bring them with you on the day of your procedure.  The [facility] providers would like to encourage you to use [facility] to communicate with providers for non-urgent requests or questions.  Due to long hold times on the telephone, sending your provider a message by [facility] may be a faster and more efficient way to get a response.  Please allow 48 business hours for a response.  Please remember that this is for non-urgent requests.   Due to recent changes in healthcare laws, you may see the results of your imaging and laboratory studies on MyChart before your provider has had a chance to review them.  We understand that in some cases there may be results that are confusing or concerning to you. Not all laboratory results come back in the same time frame and the provider may be waiting for multiple results in order to interpret others.  Please give [place] 48 hours in order for your provider to thoroughly review all the results before contacting the office for clarification of your results.

## 2022-08-22 NOTE — Progress Notes (Unsigned)
HISTORY OF PRESENT ILLNESS:  Jillian Rodriguez is a 61 y.o. female, loan processor for [employer], with past medical history as listed below who is known to me for screening colonoscopy August 2018.  She is sent today by her primary care provider regarding dysphagia and globus type sensation.  Patient tells me that she has had a globus type sensation for greater than 1 year.  She also describes intermittent solid food dysphagia items such as steak and hamburger.  She underwent ENT evaluation including laryngoscopy.  No significant abnormalities.  She was diagnosed with "silent reflux".  And placed on omeprazole 20 mg twice daily.  She has trouble remembering to take the medication 30 to 60 minutes before eating.  Last, sporadic use.  In the event, she also describes problems with chest pain.  Can occur at night and on the right side.  She had negative cardiac work-up.  She reports decreased appetite though has had some weight gain of about 6 pounds.  She is status post remote cholecystectomy around 2000.  She does describe intermittent reflux symptoms at night as manifested by pyrosis and regurgitation  REVIEW OF SYSTEMS:  All non-GI ROS negative unless otherwise stated in the HPI except for itching, anxiety, confusion, depression, fatigue, menstrual cramps, urinary frequency  Past Medical History:  Diagnosis Date   Allergy    Anxiety    Depression    Fibromyalgia    Gallstones    Hyperlipidemia    IBS (irritable bowel syndrome)     Past Surgical History:  Procedure Laterality Date   CESAREAN SECTION     x 1   CHOLECYSTECTOMY     LAPAROSCOPY     endometriosis and uterine polyp removal   OVARIAN CYST REMOVAL     VAGINAL HYSTERECTOMY  04/2007   total    Social History [employer]  reports that she has never smoked. She has never used smokeless tobacco. She reports current alcohol use. She reports that she does not use drugs.  family history includes Atrial fibrillation in her mother;  Breast cancer in her paternal grandmother; Colon polyps in her father; Diabetes in her mother; Heart disease in her father, maternal grandfather, mother, and paternal uncle; Hypertension in her father; Kidney disease in her father.  Allergies  Allergen Reactions   Penicillins Other (See Comments)    Did it involve swelling of the face/tongue/throat, SOB, or low BP? N/A Did it involve sudden or severe rash/hives, skin peeling, or any reaction on the inside of your mouth or nose? N/A Did you need to seek medical attention at a hospital or doctor's office? N/A When did it last happen?  Child If all above answers are "NO", may proceed with cephalosporin use.         PHYSICAL EXAMINATION: Vital signs: BP 122/82   Pulse 75   Ht 5' 7"  (1.702 m)   Wt 156 lb (70.8 kg)   BMI 24.43 kg/m   Constitutional: generally well-appearing, no acute distress Psychiatric: alert and oriented x3, cooperative Eyes: extraocular movements intact, anicteric, conjunctiva pink Mouth: oral pharynx moist, no lesions Neck: supple no lymphadenopathy Cardiovascular: heart regular rate and rhythm, no murmur Lungs: clear to auscultation bilaterally Abdomen: soft, nontender, nondistended, no obvious ascites, no peritoneal signs, normal bowel sounds, no organomegaly Rectal: Omitted Extremities: no clubbing, cyanosis, or lower extremity edema bilaterally Skin: no lesions on visible extremities Neuro: No focal deficits.  Cranial nerves intact  ASSESSMENT:  1.  GERD 2.  Intermittent solid  food dysphagia 3.  Globus sensation 4.  History of nonadenomatous colon polyp   PLAN:  1.  Reflux precautions 2.  Take omeprazole 40 mg daily 3.  Schedule upper endoscopy with probable esophageal dilation.The nature of the procedure, as well as the risks, benefits, and alternatives were carefully and thoroughly reviewed with the patient. Ample time for discussion and questions allowed. The patient understood, was satisfied,  and agreed to proceed.  4.  Surveillance colonoscopy around 2028 A total time of 45 minutes was spent preparing to see the patient, reviewing the myriad of outside records, obtaining comprehensive history, performing medically appropriate physical examination, counseling and educating the patient regarding the above listed issues, ordering advanced endoscopic procedure, directing medication usage, and documenting clinical information in the health record

## 2022-08-25 DIAGNOSIS — R5382 Chronic fatigue, unspecified: Secondary | ICD-10-CM | POA: Diagnosis not present

## 2022-08-25 DIAGNOSIS — R5383 Other fatigue: Secondary | ICD-10-CM | POA: Diagnosis not present

## 2022-08-25 DIAGNOSIS — M797 Fibromyalgia: Secondary | ICD-10-CM | POA: Diagnosis not present

## 2022-08-25 DIAGNOSIS — M2550 Pain in unspecified joint: Secondary | ICD-10-CM | POA: Diagnosis not present

## 2022-08-25 DIAGNOSIS — F329 Major depressive disorder, single episode, unspecified: Secondary | ICD-10-CM | POA: Diagnosis not present

## 2022-08-29 ENCOUNTER — Telehealth: Payer: Self-pay | Admitting: Internal Medicine

## 2022-08-29 NOTE — Telephone Encounter (Signed)
Spoke with pt and she is aware of Dr. Fouche's recommendations. 

## 2022-08-29 NOTE — Telephone Encounter (Signed)
Pt states her ENT had her taking omeprazole '20mg'$  bid. Pt states Dr. Henrene Pastor had her change it to omeprazole '40mg'$  in the am on an empty stomach. Reports she is having issues with no appetite, not wanting to eat. She had to "force" a little food last night and thought she was going to throw up. Also states she started taking Cymbalta '60mg'$  daily on Sat. Reports she had been on this for years and came off of it but just started it back. Pt isn't sure if this has caused any problems with the omeprazole or her appetite. Please advise.

## 2022-08-29 NOTE — Telephone Encounter (Signed)
Omeprazole is not causing the various symptoms that concern.  She does not want to take it,fine. Keep plans for endoscopy as scheduled. No plans or recommendations in the meantime

## 2022-08-29 NOTE — Telephone Encounter (Signed)
Inbound call from patient stating that Dr. Henrene Pastor wants her to take omeprazole 2 times daily in the morning on a empty stomach and she has really not been able to eat, and is seeking advice about changing the mediation again.

## 2022-09-21 ENCOUNTER — Ambulatory Visit (AMBULATORY_SURGERY_CENTER): Payer: BC Managed Care – PPO | Admitting: Internal Medicine

## 2022-09-21 ENCOUNTER — Encounter: Payer: Self-pay | Admitting: Internal Medicine

## 2022-09-21 VITALS — BP 110/68 | HR 64 | Temp 97.1°F | Resp 22 | Ht 67.0 in | Wt 156.0 lb

## 2022-09-21 DIAGNOSIS — K222 Esophageal obstruction: Secondary | ICD-10-CM | POA: Diagnosis not present

## 2022-09-21 DIAGNOSIS — R1310 Dysphagia, unspecified: Secondary | ICD-10-CM | POA: Diagnosis not present

## 2022-09-21 DIAGNOSIS — R1319 Other dysphagia: Secondary | ICD-10-CM | POA: Diagnosis not present

## 2022-09-21 DIAGNOSIS — K219 Gastro-esophageal reflux disease without esophagitis: Secondary | ICD-10-CM

## 2022-09-21 MED ORDER — SODIUM CHLORIDE 0.9 % IV SOLN: 500.0000 mL | Freq: Once | INTRAVENOUS | Status: DC

## 2022-09-21 MED ORDER — PANTOPRAZOLE SODIUM 40 MG PO TBEC: 40.0000 mg | DELAYED_RELEASE_TABLET | Freq: Every day | ORAL | 11 refills | Status: DC

## 2022-09-21 NOTE — Patient Instructions (Signed)
Handout on post-dilation diet given to patient.  Continue present medications - pick up prescription for Pantoprazole (Protonix) 40 mg daily from [facility] follow-up with Dr. Henrene Pastor in about 6 weeks    YOU HAD AN ENDOSCOPIC PROCEDURE TODAY AT [facility]:   Refer to the procedure report that was given to you for any specific questions about what was found during the examination.  If the procedure report does not answer your questions, please call your gastroenterologist to clarify.  If you requested that your care partner not be given the details of your procedure findings, then the procedure report has been included in a sealed envelope for you to review at your convenience later.  YOU SHOULD EXPECT: Some feelings of bloating in the abdomen. Passage of more gas than usual.  Walking can help get rid of the air that was put into your GI tract during the procedure and reduce the bloating. If you had a lower endoscopy (such as a colonoscopy or flexible sigmoidoscopy) you may notice spotting of blood in your stool or on the toilet paper. If you underwent a bowel prep for your procedure, you may not have a normal bowel movement for a few days.  Please Note:  You might notice some irritation and congestion in your nose or some drainage.  This is from the oxygen used during your procedure.  There is no need for concern and it should clear up in a day or so.  SYMPTOMS TO REPORT IMMEDIATELY:  Following upper endoscopy (EGD)  Vomiting of blood or coffee ground material  New chest pain or pain under the shoulder blades  Painful or persistently difficult swallowing  New shortness of breath  Fever of 100F or higher  Black, tarry-looking stools  For urgent or emergent issues, a gastroenterologist can be reached at any hour by calling [phone]. Do not use MyChart messaging for urgent concerns.    DIET:  We do recommend a small meal at first, but then you may proceed  to your regular diet.  Drink plenty of fluids but you should avoid alcoholic beverages for 24 hours.  ACTIVITY:  You should plan to take it easy for the rest of today and you should NOT DRIVE or use heavy machinery until tomorrow (because of the sedation medicines used during the test).    FOLLOW UP: Our staff will call the number listed on your records the next business day following your procedure.  We will call around 7:15- 8:00 am to check on you and address any questions or concerns that you may have regarding the information given to you following your procedure. If we do not reach you, we will leave a message.     If any biopsies were taken you will be contacted by phone or by letter within the next 1-3 weeks.  Please call us at [phone] if you have not heard about the biopsies in 3 weeks.    SIGNATURES/CONFIDENTIALITY: You and/or your care partner have signed paperwork which will be entered into your electronic medical record.  These signatures attest to the fact that that the information above on your After Visit Summary has been reviewed and is understood.  Full responsibility of the confidentiality of this discharge information lies with you and/or your care-partner.

## 2022-09-21 NOTE — Progress Notes (Signed)
Called to room to assist during endoscopic procedure.  Patient ID and intended procedure confirmed with present staff. Received instructions for my participation in the procedure from the performing physician.  

## 2022-09-21 NOTE — Progress Notes (Signed)
HISTORY OF PRESENT ILLNESS:   Jillian Rodriguez is a 61 y.o. female, loan processor for [employer], with past medical history as listed below who is known to me for screening colonoscopy August 2018.  She is sent today by her primary care provider regarding dysphagia and globus type sensation.  Patient tells me that she has had a globus type sensation for greater than 1 year.  She also describes intermittent solid food dysphagia items such as steak and hamburger.  She underwent ENT evaluation including laryngoscopy.  No significant abnormalities.  She was diagnosed with "silent reflux".  And placed on omeprazole 20 mg twice daily.  She has trouble remembering to take the medication 30 to 60 minutes before eating.  Last, sporadic use.  In the event, she also describes problems with chest pain.  Can occur at night and on the right side.  She had negative cardiac work-up.  She reports decreased appetite though has had some weight gain of about 6 pounds.  She is status post remote cholecystectomy around 2000.  She does describe intermittent reflux symptoms at night as manifested by pyrosis and regurgitation   REVIEW OF SYSTEMS:   All non-GI ROS negative unless otherwise stated in the HPI except for itching, anxiety, confusion, depression, fatigue, menstrual cramps, urinary frequency       Past Medical History:  Diagnosis Date   Allergy     Anxiety     Depression     Fibromyalgia     Gallstones     Hyperlipidemia     IBS (irritable bowel syndrome)             Past Surgical History:  Procedure Laterality Date   CESAREAN SECTION        x 1   CHOLECYSTECTOMY       LAPAROSCOPY        endometriosis and uterine polyp removal   OVARIAN CYST REMOVAL       VAGINAL HYSTERECTOMY   04/2007    total      Social History Jillian Rodriguez  reports that she has never smoked. She has never used smokeless tobacco. She reports current alcohol use. She reports that she does not use drugs.   family history  includes Atrial fibrillation in her mother; Breast cancer in her paternal grandmother; Colon polyps in her father; Diabetes in her mother; Heart disease in her father, maternal grandfather, mother, and paternal uncle; Hypertension in her father; Kidney disease in her father.        Allergies  Allergen Reactions   Penicillins Other (See Comments)      Did it involve swelling of the face/tongue/throat, SOB, or low BP? N/A Did it involve sudden or severe rash/hives, skin peeling, or any reaction on the inside of your mouth or nose? N/A Did you need to seek medical attention at a hospital or doctor's office? N/A When did it last happen?  Child If all above answers are "NO", may proceed with cephalosporin use.              PHYSICAL EXAMINATION: Vital signs: BP 122/82   Pulse 75   Ht '5\' 7"'$  (1.702 m)   Wt 156 lb (70.8 kg)   BMI 24.43 kg/m   Constitutional: generally well-appearing, no acute distress Psychiatric: alert and oriented x3, cooperative Eyes: extraocular movements intact, anicteric, conjunctiva pink Mouth: oral pharynx moist, no lesions Neck: supple no lymphadenopathy Cardiovascular: heart regular rate and rhythm, no murmur Lungs: clear to auscultation bilaterally  Abdomen: soft, nontender, nondistended, no obvious ascites, no peritoneal signs, normal bowel sounds, no organomegaly Rectal: Omitted Extremities: no clubbing, cyanosis, or lower extremity edema bilaterally Skin: no lesions on visible extremities Neuro: No focal deficits.  Cranial nerves intact   ASSESSMENT:   1.  GERD 2.  Intermittent solid food dysphagia 3.  Globus sensation 4.  History of nonadenomatous colon polyp     PLAN:   1.  Reflux precautions 2.  Take omeprazole 40 mg daily 3.  Schedule upper endoscopy with probable esophageal dilation.The nature of the procedure, as well as the risks, benefits, and alternatives were carefully and thoroughly reviewed with the patient. Ample time for discussion  and questions allowed. The patient understood, was satisfied, and agreed to proceed.  4.  Surveillance colonoscopy around 2028

## 2022-09-21 NOTE — Progress Notes (Signed)
Vss nad trans to pacu °

## 2022-09-21 NOTE — Op Note (Signed)
[place] Patient Name: Jillian Rodriguez Procedure Date: 09/21/2022 10:43 AM MRN: [id number] Endoscopist: Docia Chuck. Henrene Pastor , MD Age: 61 Referring MD:  Date of Birth: [birth year] Gender: Female Account #: [id number] Procedure:                Upper GI endoscopy with [facility] dilation of the                            esophagus. 1 French Indications:              Dysphagia, Suspected esophageal reflux Medicines:                Monitored Anesthesia Care Procedure:                Pre-Anesthesia Assessment:                           - Prior to the procedure, a History and Physical                            was performed, and patient medications and                            allergies were reviewed. The patient's tolerance of                            previous anesthesia was also reviewed. The risks                            and benefits of the procedure and the sedation                            options and risks were discussed with the patient.                            All questions were answered, and informed consent                            was obtained. Prior Anticoagulants: The patient has                            taken no previous anticoagulant or antiplatelet                            agents. ASA Grade Assessment: II - A patient with                            mild systemic disease. After reviewing the risks                            and benefits, the patient was deemed in                            satisfactory condition to undergo the procedure.  After obtaining informed consent, the endoscope was                            passed under direct vision. Throughout the                            procedure, the patient's blood pressure, pulse, and                            oxygen saturations were monitored continuously. The                            Endoscope was introduced through the mouth, and                            advanced to the  second part of duodenum. The upper                            GI endoscopy was accomplished without difficulty.                            The patient tolerated the procedure well. Scope In: Scope Out: Findings:                 One benign-appearing, intrinsic moderate ring like                            stenosis was found 35 cm from the incisors. This                            stenosis measured 1.5 cm (inner diameter). After                            completing the endoscopic survey, the scope was                            withdrawn. Dilation was performed with a Maloney                            dilator with no resistance at 54 Fr. mild                            resistance. No heme                           The exam of the esophagus was otherwise normal.                           The stomach revealed a 4 cm sliding hiatal hernia.                            The stomach was otherwise normal.  The examined duodenum was normal.                           The cardia and gastric fundus were normal on                            retroflexion. Complications:            No immediate complications. Estimated Blood Loss:     Estimated blood loss: none. Impression:               1. GERD complicated by peptic stricture status post                            dilation                           2. Moderate hiatal hernia. Otherwise normal EGD. Recommendation:           1. Patient has a contact number available for                            emergencies. The signs and symptoms of potential                            delayed complications were discussed with the                            patient. Return to normal activities tomorrow.                            Written discharge instructions were provided to the                            patient.                           2. Post dilation diet.                           3. Continue present medications.                            4. PRESCRIBE PANTOPRAZOLE 40 mg daily; #30; 11                            refills                           5. Office follow-up with Dr. Henrene Pastor in about 6 weeks Docia Chuck. Henrene Pastor, MD 09/21/2022 11:06:16 AM This report has been signed electronically.

## 2022-09-22 ENCOUNTER — Telehealth: Payer: Self-pay | Admitting: *Deleted

## 2022-09-22 NOTE — Telephone Encounter (Signed)
  Follow up Call-    Row Labels 09/21/2022    9:48 AM  Call back number   Section Header. No data exists in this row.   Post procedure Call Back phone  #   [phone]  Permission to leave phone message   Yes     Patient questions:  Do you have a fever, pain , or abdominal swelling? No. Pain Score  0 *  Have you tolerated food without any problems? Yes.    Have you been able to return to your normal activities? Yes.    Do you have any questions about your discharge instructions: Diet   No. Medications  No. Follow up visit  No.  Do you have questions or concerns about your Care? No.  Actions: * If pain score is 4 or above: No action needed, pain <4.

## 2022-10-25 ENCOUNTER — Ambulatory Visit: Payer: BC Managed Care – PPO | Admitting: Cardiology

## 2022-11-08 ENCOUNTER — Ambulatory Visit: Payer: BC Managed Care – PPO | Admitting: Internal Medicine

## 2022-11-08 ENCOUNTER — Encounter: Payer: Self-pay | Admitting: Internal Medicine

## 2022-11-08 VITALS — BP 120/70 | HR 48 | Ht 67.5 in | Wt 154.0 lb

## 2022-11-08 DIAGNOSIS — K222 Esophageal obstruction: Secondary | ICD-10-CM

## 2022-11-08 DIAGNOSIS — R1319 Other dysphagia: Secondary | ICD-10-CM

## 2022-11-08 DIAGNOSIS — K219 Gastro-esophageal reflux disease without esophagitis: Secondary | ICD-10-CM | POA: Diagnosis not present

## 2022-11-08 MED ORDER — PANTOPRAZOLE SODIUM 40 MG PO TBEC: 40.0000 mg | DELAYED_RELEASE_TABLET | Freq: Every day | ORAL | 3 refills | Status: DC

## 2022-11-08 NOTE — Progress Notes (Signed)
HISTORY OF PRESENT ILLNESS:  Jillian Rodriguez is a 61 y.o. female who was evaluated in the office August 22, 2022 regarding GERD with intermittent solid food dysphagia and globus type sensation.  See that dictation for details.  She subsequently underwent upper endoscopy September 21, 2022.  She was found to have a distal esophageal stricture and a moderate hiatal hernia.  The stricture was dilated with 54 [place] Maloney dilator.  She was prescribed pantoprazole 40 mg daily and asked to follow-up at this time.  She is accompanied by her husband.  Patient reports that she has had no reflux symptoms.  Her dysphagia has resolved as has the globus type sensation.  She is tolerating the medication well without any appreciable side effects.  She does have multiple questions including the nature of her problem, duration of medical therapy, and the significance of hiatal hernia.  REVIEW OF SYSTEMS:  All non-GI ROS negative unless otherwise stated in the HPI except for anxiety, depression  Past Medical History:  Diagnosis Date   Allergy    Anxiety    Cataract    Depression    Fibromyalgia    Gallstones    GERD (gastroesophageal reflux disease)    Hyperlipidemia    IBS (irritable bowel syndrome)     Past Surgical History:  Procedure Laterality Date   CATARACT EXTRACTION Bilateral    CESAREAN SECTION     x 1   CHOLECYSTECTOMY     LAPAROSCOPY     endometriosis and uterine polyp removal   OVARIAN CYST REMOVAL     VAGINAL HYSTERECTOMY  04/2007   total   WRIST FRACTURE SURGERY Right     Social History LEONI GOODNESS  reports that she has never smoked. She has never used smokeless tobacco. She reports current alcohol use. She reports that she does not use drugs.  family history includes Atrial fibrillation in her mother; Breast cancer in her paternal grandmother; Colon polyps in her father; Diabetes in her mother; Heart disease in her father, maternal grandfather, mother, and paternal uncle;  Hypertension in her father; Kidney disease in her father.  Allergies  Allergen Reactions   Penicillins Other (See Comments)    Did it involve swelling of the face/tongue/throat, SOB, or low BP? N/A Did it involve sudden or severe rash/hives, skin peeling, or any reaction on the inside of your mouth or nose? N/A Did you need to seek medical attention at a hospital or doctor's office? N/A When did it last happen?  Child If all above answers are "NO", may proceed with cephalosporin use.         PHYSICAL EXAMINATION: Vital signs: BP 120/70   Pulse (!) 48   Ht 5' 7.5" (1.715 m)   Wt 154 lb (69.9 kg)   BMI 23.76 kg/m  General: Well-developed, well-nourished, no acute distress HEENT: Sclerae are anicteric Abdomen: Not reexamined Psychiatric: alert and oriented x3. Cooperative   ASSESSMENT:  1.  GERD complicated by peptic stricture.  Asymptomatic post dilation on PPI 2.  Screening colonoscopy August 2018.  Diverticulosis.  Internal hemorrhoids.  Negative for neoplasia polyp   PLAN:  1.  Discussion on GERD including diagrams.  Multiple questions answered. 2.  Reflux precautions 3.  Continue pantoprazole 40 mg daily.  Prescribed.  Medication risk reviewed 4.  Routine office follow-up 1 year.  Contact the office in the interim for questions or problems. 5.  Follow-up screening colonoscopy around August 2028 A total time of 30 minutes was spent preparing  to see the patient, reviewing data, obtaining interval history, performing medically appropriate physical exam, counseling and educating the patient and her husband regarding the above listed issues, answering questions, ordering medication, and documenting clinical information in the health record

## 2022-11-08 NOTE — Patient Instructions (Signed)
_______________________________________________________  If you are age 61 or older, your body mass index should be between 23-30. Your Body mass index is 23.76 kg/m. If this is out of the aforementioned range listed, please consider follow up with your Primary Care Provider.  If you are age 4 or younger, your body mass index should be between 19-25. Your Body mass index is 23.76 kg/m. If this is out of the aformentioned range listed, please consider follow up with your Primary Care Provider.   ________________________________________________________  The [place] providers would like to encourage you to use [facility] to communicate with providers for non-urgent requests or questions.  Due to long hold times on the telephone, sending your provider a message by [facility] may be a faster and more efficient way to get a response.  Please allow 48 business hours for a response.  Please remember that this is for non-urgent requests.  _______________________________________________________  We have sent the following medications to your pharmacy for you to pick up at your convenience:  Protonix  Please follow up in one year

## 2022-12-07 DIAGNOSIS — L905 Scar conditions and fibrosis of skin: Secondary | ICD-10-CM | POA: Diagnosis not present

## 2022-12-07 DIAGNOSIS — L814 Other melanin hyperpigmentation: Secondary | ICD-10-CM | POA: Diagnosis not present

## 2022-12-07 DIAGNOSIS — L821 Other seborrheic keratosis: Secondary | ICD-10-CM | POA: Diagnosis not present

## 2022-12-07 DIAGNOSIS — D2372 Other benign neoplasm of skin of left lower limb, including hip: Secondary | ICD-10-CM | POA: Diagnosis not present

## 2022-12-07 DIAGNOSIS — D0471 Carcinoma in situ of skin of right lower limb, including hip: Secondary | ICD-10-CM | POA: Diagnosis not present

## 2023-01-18 ENCOUNTER — Encounter: Payer: Self-pay | Admitting: Internal Medicine

## 2023-02-23 ENCOUNTER — Telehealth: Payer: Self-pay | Admitting: Internal Medicine

## 2023-02-23 NOTE — Telephone Encounter (Signed)
Inbound call from patient f/u on previous note.  Please advise.

## 2023-02-23 NOTE — Telephone Encounter (Signed)
Inbound call from patient stating that she needed a refill for Protonix and her pharmacy stated she needed a pre auth. Please advise.

## 2023-02-28 ENCOUNTER — Other Ambulatory Visit (HOSPITAL_COMMUNITY): Payer: Self-pay

## 2023-02-28 ENCOUNTER — Telehealth: Payer: Self-pay | Admitting: Pharmacy Technician

## 2023-02-28 DIAGNOSIS — K219 Gastro-esophageal reflux disease without esophagitis: Secondary | ICD-10-CM

## 2023-02-28 DIAGNOSIS — K222 Esophageal obstruction: Secondary | ICD-10-CM

## 2023-02-28 DIAGNOSIS — R1319 Other dysphagia: Secondary | ICD-10-CM

## 2023-02-28 MED ORDER — PANTOPRAZOLE SODIUM 40 MG PO TBEC: 40.0000 mg | DELAYED_RELEASE_TABLET | Freq: Every day | ORAL | 3 refills | Status: DC

## 2023-02-28 NOTE — Telephone Encounter (Signed)
Jillian Rodriguez will you please send refill for patient and advise her that it has been sent. Please advise.

## 2023-02-28 NOTE — Telephone Encounter (Signed)
Pantoprazole refill sent;  patient notified by [facility]

## 2023-02-28 NOTE — Telephone Encounter (Signed)
Patient Advocate Encounter  Received notification from [facility] that prior authorization for PANTOPRAZOLE is required.   PA submitted on 3.6.24 Key [id number] Status is pending

## 2023-02-28 NOTE — Telephone Encounter (Signed)
Patient Advocate Encounter  Prior Authorization for PANTOPRZOLE has been approved.    PA#  [id number] Effective dates: 3.6.24 through 3.6.27

## 2023-07-30 ENCOUNTER — Telehealth: Payer: Self-pay | Admitting: Internal Medicine

## 2023-07-30 NOTE — Telephone Encounter (Signed)
Pt states she is having problems with swallowing/globus sensation/epigastric pain and requesting to be seen by Dr. Marina Goodell. Pt scheduled to see Dr. Marina Goodell 08/23/23 at 11:40am. Pt aware of appt.

## 2023-07-30 NOTE — Telephone Encounter (Signed)
Patient called requesting to speak with a nurse states she is having issues with her throat again.

## 2023-08-17 ENCOUNTER — Ambulatory Visit: Payer: Self-pay | Admitting: Cardiology

## 2023-08-23 ENCOUNTER — Encounter: Payer: Self-pay | Admitting: Internal Medicine

## 2023-08-23 ENCOUNTER — Ambulatory Visit: Payer: No Typology Code available for payment source | Admitting: Internal Medicine

## 2023-08-23 VITALS — BP 120/78 | HR 60 | Ht 67.0 in | Wt 160.0 lb

## 2023-08-23 DIAGNOSIS — K222 Esophageal obstruction: Secondary | ICD-10-CM | POA: Diagnosis not present

## 2023-08-23 DIAGNOSIS — R1319 Other dysphagia: Secondary | ICD-10-CM

## 2023-08-23 DIAGNOSIS — K219 Gastro-esophageal reflux disease without esophagitis: Secondary | ICD-10-CM

## 2023-08-23 DIAGNOSIS — K5900 Constipation, unspecified: Secondary | ICD-10-CM | POA: Diagnosis not present

## 2023-08-23 MED ORDER — PANTOPRAZOLE SODIUM 40 MG PO TBEC: 40.0000 mg | DELAYED_RELEASE_TABLET | Freq: Two times a day (BID) | ORAL | 3 refills | Status: DC

## 2023-08-23 NOTE — Progress Notes (Signed)
HISTORY OF PRESENT ILLNESS:  Jillian Rodriguez is a 62 y.o. female with past medical history as listed below who presents today with a chief complaint of breakthrough reflux symptoms, recurrent intermittent solid food dysphagia, and recurrent globus type symptoms.  In addition epigastric pain and constipation.  I last saw the patient in the office November 08, 2022.  She has a history of GERD complicated by peptic stricture for which she underwent upper endoscopy with esophageal dilation (54 [name]) September 2023.  She was placed on pantoprazole 40 mg daily.  At the time of her follow-up in November 2023 she reported doing better post dilation of PPI with all complaints resolved.  Routine follow-up in 1 year recommended.  Patient tells me that in recent months she has noticed some mild intermittent solid food dysphagia.  Not as bad as previous.  As well as a recurrent globus type symptoms.  Again, not as bad as previous.  She is noticing occasional regurgitation with pyrosis despite compliance with PPI therapy.  Initially no issues like this when initiating PPI therapy.  She reports having epigastric discomfort upon awakening in the mornings.  This dissipates nonspecifically.  Finally, some issues with constipation.  No worrisome features.  She did undergo complete colonoscopy in August 2018.  Examination revealed a diminutive inflammatory polyp and diverticulosis.  Follow-up in 10 years recommended.  She tells me that she is traveling to [place] than [place].  She wanted evaluated prior to her trip.  GI review of systems is otherwise negative  REVIEW OF SYSTEMS:  All non-GI ROS negative unless otherwise stated in the HPI. Past Medical History:  Diagnosis Date   Allergy    Anxiety    Cataract    Depression    Fibromyalgia    Gallstones    GERD (gastroesophageal reflux disease)    Hyperlipidemia    IBS (irritable bowel syndrome)     Past Surgical History:  Procedure Laterality Date    CATARACT EXTRACTION Bilateral    CESAREAN SECTION     x 1   CHOLECYSTECTOMY     LAPAROSCOPY     endometriosis and uterine polyp removal   OVARIAN CYST REMOVAL     VAGINAL HYSTERECTOMY  04/2007   total   WRIST FRACTURE SURGERY Right     Social History Jillian Rodriguez  reports that she has never smoked. She has never used smokeless tobacco. She reports current alcohol use. She reports that she does not use drugs.  family history includes Atrial fibrillation in her mother; Breast cancer in her paternal grandmother; Colon polyps in her father; Diabetes in her mother; Heart disease in her father, maternal grandfather, mother, and paternal uncle; Hypertension in her father; Kidney disease in her father.  Allergies  Allergen Reactions   Penicillins Other (See Comments)    Did it involve swelling of the face/tongue/throat, SOB, or low BP? N/A Did it involve sudden or severe rash/hives, skin peeling, or any reaction on the inside of your mouth or nose? N/A Did you need to seek medical attention at a hospital or doctor's office? N/A When did it last happen?  Child If all above answers are "NO", may proceed with cephalosporin use.         PHYSICAL EXAMINATION: Vital signs: BP 120/78   Pulse 60   Ht 5\' 7"  (1.702 m)   Wt 160 lb (72.6 kg)   BMI 25.06 kg/m   Constitutional: generally well-appearing, no acute distress Psychiatric: alert and oriented x3, cooperative  Eyes: extraocular movements intact, anicteric, conjunctiva pink Mouth: oral pharynx moist, no lesions Neck: supple no lymphadenopathy Cardiovascular: heart regular rate and rhythm, no murmur Lungs: clear to auscultation bilaterally Abdomen: soft, nontender, nondistended, no obvious ascites, no peritoneal signs, normal bowel sounds, no organomegaly Rectal: Omitted Extremities: no clubbing, cyanosis, or lower extremity edema bilaterally Skin: no lesions on visible extremities Neuro: No focal deficits.  Cranial nerves  intact  ASSESSMENT:  1.  GERD.  Breakthrough symptoms. 2.  Epigastric discomfort in the mornings.  Suspect secondary to breakthrough GERD. 3.  Recurrent intermittent solid food dysphagia secondary to known peptic stricture.  Last dilation September 2023 4.  Mild globus symptoms 5.  Functional constipation 6.  Colonoscopy 2018 was negative for neoplasia   PLAN:  1.  Reflux precautions 2.  Increase pantoprazole 40 mg twice daily.  Prescribed 3.  Schedule upper endoscopy with esophageal dilation.The nature of the procedure, as well as the risks, benefits, and alternatives were carefully and thoroughly reviewed with the patient. Ample time for discussion and questions allowed. The patient understood, was satisfied, and agreed to proceed. 4.  Initiate Citrucel 2 tablespoons daily for constipation 5.  Routine repeat screening colonoscopy around 2028 A total time of 30 minutes was spent preparing to see the patient, obtaining comprehensive history, performing medically appropriate physical examination, counseling and educating the patient regarding the above listed issues, ordering medication, ordering therapeutic endoscopic procedure, initiating symptomatic therapy, and documenting clinical information in the health record

## 2023-08-23 NOTE — Patient Instructions (Signed)
We have sent the following medications to your pharmacy for you to pick up at your convenience:  Pantoprazole - twice a day.  You have been scheduled for an endoscopy. Please follow written instructions given to you at your visit today.  If you use inhalers (even only as needed), please bring them with you on the day of your procedure.  If you take any of the following medications, they will need to be adjusted prior to your procedure:   DO NOT TAKE 7 DAYS PRIOR TO TEST- Trulicity (dulaglutide) Ozempic, Wegovy (semaglutide) Mounjaro (tirzepatide) Bydureon Bcise (exanatide extended release)  DO NOT TAKE 1 DAY PRIOR TO YOUR TEST Rybelsus (semaglutide) Adlyxin (lixisenatide) Victoza (liraglutide) Byetta (exanatide) ___________________________________________________________________________    _______________________________________________________  If your blood pressure at your visit was 140/90 or greater, please contact your primary care physician to follow up on this.  _______________________________________________________  If you are age 38 or older, your body mass index should be between 23-30. Your Body mass index is 25.06 kg/m. If this is out of the aforementioned range listed, please consider follow up with your Primary Care Provider.  If you are age 41 or younger, your body mass index should be between 19-25. Your Body mass index is 25.06 kg/m. If this is out of the aformentioned range listed, please consider follow up with your Primary Care Provider.   ________________________________________________________  The  GI providers would like to encourage you to use [facility] to communicate with providers for non-urgent requests or questions.  Due to long hold times on the telephone, sending your provider a message by [facility] may be a faster and more efficient way to get a response.  Please allow 48 business hours for a response.  Please remember that this is for  non-urgent requests.  _______________________________________________________

## 2023-08-30 ENCOUNTER — Ambulatory Visit: Payer: No Typology Code available for payment source | Admitting: Internal Medicine

## 2023-08-30 ENCOUNTER — Encounter: Payer: Self-pay | Admitting: Internal Medicine

## 2023-08-30 VITALS — BP 114/74 | HR 65 | Temp 97.8°F | Resp 22 | Ht 67.0 in | Wt 160.0 lb

## 2023-08-30 DIAGNOSIS — K222 Esophageal obstruction: Secondary | ICD-10-CM | POA: Diagnosis present

## 2023-08-30 DIAGNOSIS — K219 Gastro-esophageal reflux disease without esophagitis: Secondary | ICD-10-CM | POA: Diagnosis not present

## 2023-08-30 DIAGNOSIS — R1319 Other dysphagia: Secondary | ICD-10-CM

## 2023-08-30 MED ORDER — SODIUM CHLORIDE 0.9 % IV SOLN: 500.0000 mL | Freq: Once | INTRAVENOUS | Status: DC

## 2023-08-30 NOTE — Progress Notes (Signed)
VS by DT  Pt's states no medical or surgical changes since previsit or office visit.  

## 2023-08-30 NOTE — Patient Instructions (Addendum)
Resume previous diet Continue present medications Office visit with Dr Marina Goodell on 10/23/23 at 9 am  Handouts/information given for esophageal stenosis, esophageal dilation and hiatal hernia  YOU HAD AN ENDOSCOPIC PROCEDURE TODAY AT THE [facility]:   Refer to the procedure report that was given to you for any specific questions about what was found during the examination.  If the procedure report does not answer your questions, please call your gastroenterologist to clarify.  If you requested that your care partner not be given the details of your procedure findings, then the procedure report has been included in a sealed envelope for you to review at your convenience later.  YOU SHOULD EXPECT: Some feelings of bloating in the abdomen. Passage of more gas than usual.  Walking can help get rid of the air that was put into your GI tract during the procedure and reduce the bloating. If you had a lower endoscopy (such as a colonoscopy or flexible sigmoidoscopy) you may notice spotting of blood in your stool or on the toilet paper. If you underwent a bowel prep for your procedure, you may not have a normal bowel movement for a few days.  Please Note:  You might notice some irritation and congestion in your nose or some drainage.  This is from the oxygen used during your procedure.  There is no need for concern and it should clear up in a day or so.  SYMPTOMS TO REPORT IMMEDIATELY:  Following upper endoscopy (EGD)  Vomiting of blood or coffee ground material  New chest pain or pain under the shoulder blades  Painful or persistently difficult swallowing  New shortness of breath  Fever of 100F or higher  Black, tarry-looking stools  For urgent or emergent issues, a gastroenterologist can be reached at any hour by calling [phone]. Do not use MyChart messaging for urgent concerns.   DIET:  We do recommend a small meal at first, but then you may proceed to your regular diet.  Drink  plenty of fluids but you should avoid alcoholic beverages for 24 hours.  ACTIVITY:  You should plan to take it easy for the rest of today and you should NOT DRIVE or use heavy machinery until tomorrow (because of the sedation medicines used during the test).    FOLLOW UP: Our staff will call the number listed on your records the next business day following your procedure.  We will call around 7:15- 8:00 am to check on you and address any questions or concerns that you may have regarding the information given to you following your procedure. If we do not reach you, we will leave a message.     SIGNATURES/CONFIDENTIALITY: You and/or your care partner have signed paperwork which will be entered into your electronic medical record.  These signatures attest to the fact that that the information above on your After Visit Summary has been reviewed and is understood.  Full responsibility of the confidentiality of this discharge information lies with you and/or your care-partner.

## 2023-08-30 NOTE — Op Note (Addendum)
[facility] Patient Name: Jillian Rodriguez Procedure Date: 08/30/2023 10:38 AM MRN: [id number] Endoscopist: Wilhemina Bonito. Marina Goodell , MD, [id number] Age: 62 Referring MD:  Date of Birth: October 28, 1961 Gender: Female Account #: [id number] Procedure:                Upper GI endoscopy with 31 F Maloney dilation Indications:              Dysphagia, Therapeutic procedure, Esophageal reflux Medicines:                Monitored Anesthesia Care Procedure:                Pre-Anesthesia Assessment:                           - Prior to the procedure, a History and Physical                            was performed, and patient medications and                            allergies were reviewed. The patient's tolerance of                            previous anesthesia was also reviewed. The risks                            and benefits of the procedure and the sedation                            options and risks were discussed with the patient.                            All questions were answered, and informed consent                            was obtained. Prior Anticoagulants: The patient has                            taken no anticoagulant or antiplatelet agents. ASA                            Grade Assessment: II - A patient with mild systemic                            disease. After reviewing the risks and benefits,                            the patient was deemed in satisfactory condition to                            undergo the procedure.                           After obtaining informed consent, the endoscope was  passed under direct vision. Throughout the                            procedure, the patient's blood pressure, pulse, and                            oxygen saturations were monitored continuously. The                            GIF [id number] #[id number] was introduced through the                            mouth, and advanced to the second part of duodenum.                             The upper GI endoscopy was accomplished without                            difficulty. The patient tolerated the procedure                            well. Scope In: Scope Out: Findings:                 One benign-appearing, intrinsic moderate stenosis                            was found 35 cm from the incisors. This stenosis                            measured 1.5 cm (inner diameter). The scope was                            withdrawn. Dilation was performed with a Maloney                            dilator with no resistance at 54 Fr.                           The exam of the esophagus was otherwise normal.                           The stomach was normal. Moderate H/H.                           The examined duodenum was normal.                           The cardia and gastric fundus were normal on                            retroflexion. Complications:            No immediate complications. Estimated Blood Loss:     Estimated blood loss: none. Impression:               -  Benign-appearing esophageal stenosis. Dilated.                           - Normal stomach. H/H                           - Normal examined duodenum.                           - No specimens collected. Recommendation:           - Patient has a contact number available for                            emergencies. The signs and symptoms of potential                            delayed complications were discussed with the                            patient. Return to normal activities tomorrow.                            Written discharge instructions were provided to the                            patient.                           - Resume previous diet.                           - Continue present medications.                           - OV Dr Marina Goodell in 6 weeks Wilhemina Bonito. Marina Goodell, MD 08/30/2023 10:56:46 AM This report has been signed electronically.

## 2023-08-30 NOTE — Progress Notes (Signed)
Expand All Collapse All HISTORY OF PRESENT ILLNESS:   Jillian Rodriguez is a 62 y.o. female with past medical history as listed below who presents today with a chief complaint of breakthrough reflux symptoms, recurrent intermittent solid food dysphagia, and recurrent globus type symptoms.  In addition epigastric pain and constipation.   I last saw the patient in the office November 08, 2022.  She has a history of GERD complicated by peptic stricture for which she underwent upper endoscopy with esophageal dilation (54 Jillian Rodriguez) September 2023.  She was placed on pantoprazole 40 mg daily.  At the time of her follow-up in November 2023 she reported doing better post dilation of PPI with all complaints resolved.  Routine follow-up in 1 year recommended.   Patient tells me that in recent months she has noticed some mild intermittent solid food dysphagia.  Not as bad as previous.  As well as a recurrent globus type symptoms.  Again, not as bad as previous.  She is noticing occasional regurgitation with pyrosis despite compliance with PPI therapy.  Initially no issues like this when initiating PPI therapy.  She reports having epigastric discomfort upon awakening in the mornings.  This dissipates nonspecifically.  Finally, some issues with constipation.  No worrisome features.  She did undergo complete colonoscopy in August 2018.  Examination revealed a diminutive inflammatory polyp and diverticulosis.  Follow-up in 10 years recommended.   She tells me that she is traveling to [place] than [place].  She wanted evaluated prior to her trip.  GI review of systems is otherwise negative   REVIEW OF SYSTEMS:   All non-GI ROS negative unless otherwise stated in the HPI.     Past Medical History:  Diagnosis Date   Allergy     Anxiety     Cataract     Depression     Fibromyalgia     Gallstones     GERD (gastroesophageal reflux disease)     Hyperlipidemia     IBS (irritable bowel syndrome)                  Past Surgical History:  Procedure Laterality Date   CATARACT EXTRACTION Bilateral     CESAREAN SECTION        x 1   CHOLECYSTECTOMY       LAPAROSCOPY        endometriosis and uterine polyp removal   OVARIAN CYST REMOVAL       VAGINAL HYSTERECTOMY   04/2007    total   WRIST FRACTURE SURGERY Right            Social History Jillian Rodriguez  reports that she has never smoked. She has never used smokeless tobacco. She reports current alcohol use. She reports that she does not use drugs.   family history includes Atrial fibrillation in her mother; Breast cancer in her paternal grandmother; Colon polyps in her father; Diabetes in her mother; Heart disease in her father, maternal grandfather, mother, and paternal uncle; Hypertension in her father; Kidney disease in her father.   Allergies       Allergies  Allergen Reactions   Penicillins Other (See Comments)      Did it involve swelling of the face/tongue/throat, SOB, or low BP? N/A Did it involve sudden or severe rash/hives, skin peeling, or any reaction on the inside of your mouth or nose? N/A Did you need to seek medical attention at a hospital or doctor's office? N/A When did it last  happen?  Child If all above answers are "NO", may proceed with cephalosporin use.                PHYSICAL EXAMINATION: Vital signs: BP 120/78   Pulse 60   Ht 5\' 7"  (1.702 m)   Wt 160 lb (72.6 kg)   BMI 25.06 kg/m   Constitutional: generally well-appearing, no acute distress Psychiatric: alert and oriented x3, cooperative Eyes: extraocular movements intact, anicteric, conjunctiva pink Mouth: oral pharynx moist, no lesions Neck: supple no lymphadenopathy Cardiovascular: heart regular rate and rhythm, no murmur Lungs: clear to auscultation bilaterally Abdomen: soft, nontender, nondistended, no obvious ascites, no peritoneal signs, normal bowel sounds, no organomegaly Rectal: Omitted Extremities: no clubbing, cyanosis, or lower  extremity edema bilaterally Skin: no lesions on visible extremities Neuro: No focal deficits.  Cranial nerves intact   ASSESSMENT:   1.  GERD.  Breakthrough symptoms. 2.  Epigastric discomfort in the mornings.  Suspect secondary to breakthrough GERD. 3.  Recurrent intermittent solid food dysphagia secondary to known peptic stricture.  Last dilation September 2023 4.  Mild globus symptoms 5.  Functional constipation 6.  Colonoscopy 2018 was negative for neoplasia     PLAN:   1.  Reflux precautions 2.  Increase pantoprazole 40 mg twice daily.  Prescribed 3.  Schedule upper endoscopy with esophageal dilation.The nature of the procedure, as well as the risks, benefits, and alternatives were carefully and thoroughly reviewed with the patient. Ample time for discussion and questions allowed. The patient understood, was satisfied, and agreed to proceed. 4.  Initiate Citrucel 2 tablespoons daily for constipation 5.  Routine repeat screening colonoscopy around 2028

## 2023-08-30 NOTE — Progress Notes (Signed)
Vss nad trans to pacu 

## 2023-08-30 NOTE — Progress Notes (Signed)
Called to room to assist during endoscopic procedure.  Patient ID and intended procedure confirmed with present staff. Received instructions for my participation in the procedure from the performing physician.  

## 2023-08-31 ENCOUNTER — Telehealth: Payer: Self-pay | Admitting: *Deleted

## 2023-08-31 NOTE — Telephone Encounter (Signed)
No answer for post procedure call back. Left VM. 

## 2023-09-11 ENCOUNTER — Ambulatory Visit: Payer: Self-pay | Admitting: Cardiology

## 2023-09-24 ENCOUNTER — Ambulatory Visit: Payer: Self-pay | Admitting: Cardiology

## 2023-09-25 ENCOUNTER — Ambulatory Visit: Payer: No Typology Code available for payment source | Attending: Cardiology | Admitting: Cardiology

## 2023-09-25 ENCOUNTER — Encounter: Payer: Self-pay | Admitting: Cardiology

## 2023-09-25 VITALS — BP 122/80 | HR 53 | Resp 16 | Ht 67.0 in | Wt 157.2 lb

## 2023-09-25 DIAGNOSIS — Z01812 Encounter for preprocedural laboratory examination: Secondary | ICD-10-CM | POA: Diagnosis not present

## 2023-09-25 DIAGNOSIS — I340 Nonrheumatic mitral (valve) insufficiency: Secondary | ICD-10-CM

## 2023-09-25 DIAGNOSIS — R072 Precordial pain: Secondary | ICD-10-CM

## 2023-09-25 DIAGNOSIS — R0609 Other forms of dyspnea: Secondary | ICD-10-CM | POA: Diagnosis not present

## 2023-09-25 DIAGNOSIS — R001 Bradycardia, unspecified: Secondary | ICD-10-CM | POA: Diagnosis not present

## 2023-09-25 DIAGNOSIS — I10 Essential (primary) hypertension: Secondary | ICD-10-CM

## 2023-09-25 MED ORDER — METOPROLOL TARTRATE 25 MG PO TABS: 12.5000 mg | ORAL_TABLET | Freq: Two times a day (BID) | ORAL | 0 refills | Status: AC

## 2023-09-25 NOTE — Patient Instructions (Signed)
Medication Instructions:  Please take 12.5 mg of lopressor twice a day for one week prior to your coronary CTA. Stop taking this medication once your CTA is completed.  *If you need a refill on your cardiac medications before your next appointment, please call your pharmacy*   Lab Work: Please completed a Basic metabolic panel in our lab before you leave today. You do not need to be fasting.  If you have labs (blood work) drawn today and your tests are completely normal, you will receive your results only by: MyChart Message (if you have MyChart) OR A paper copy in the mail If you have any lab test that is abnormal or we need to change your treatment, we will call you to review the results.   Testing/Procedures:   Your cardiac CT will be scheduled at:   [facility] [address] [place], [place] [address] [phone]  Please arrive at the [facility] and Children[facility] Entrance (Entrance C2) of [facility] 30 minutes prior to test start time. You can use the FREE valet parking offered at entrance C (encouraged to control the heart rate for the test)  Proceed to the [facility] Radiology Department (first floor) to check-in and test prep.  All radiology patients and guests should use entrance C2 at [facility], accessed from [facility], even though the hospital's physical address listed is [address].      Please follow these instructions carefully (unless otherwise directed):  An IV will be required for this test and Nitroglycerin will be given.  Hold all erectile dysfunction medications at least 3 days (72 hrs) prior to test. (Ie viagra, cialis, sildenafil, tadalafil, etc)   On the Night Before the Test: Be sure to Drink plenty of water. Do not consume any caffeinated/decaffeinated beverages or chocolate 12 hours prior to your test. Do not take any antihistamines 12 hours prior to your test.  On the Day of the Test: Drink  plenty of water until 1 hour prior to the test. Do not eat any food 1 hour prior to test. You may take your regular medications prior to the test.  Take your last dose of metoprolol (Lopressor) two hours prior to test. If you take Furosemide/Hydrochlorothiazide/Spironolactone, please HOLD on the morning of the test. FEMALES- please wear underwire-free bra if available, avoid dresses & tight clothing        After the Test: Drink plenty of water. After receiving IV contrast, you may experience a mild flushed feeling. This is normal. On occasion, you may experience a mild rash up to 24 hours after the test. This is not dangerous. If this occurs, you can take Benadryl 25 mg and increase your fluid intake. If you experience trouble breathing, this can be serious. If it is severe call 911 IMMEDIATELY. If it is mild, please call our office. If you take any of these medications: Glipizide/Metformin, Avandament, Glucavance, please do not take 48 hours after completing test unless otherwise instructed.  We will call to schedule your test 2-4 weeks out understanding that some insurance companies will need an authorization prior to the service being performed.   For more information and frequently asked questions, please visit our website : [email or web address]  For non-scheduling related questions, please contact the cardiac imaging nurse navigator should you have any questions/concerns: Cardiac Imaging Nurse Navigators Direct Office Dial: [phone]   For scheduling needs, including cancellations and rescheduling, please call [place], [phone].    Follow-Up: At [facility], you and your health needs are our priority.  As part of our continuing mission to provide you with exceptional heart care, we have created designated Provider Care Teams.  These Care Teams include your primary Cardiologist (physician) and Advanced Practice Providers (APPs -  Physician Assistants  and Nurse Practitioners) who all work together to provide you with the care you need, when you need it.  We recommend signing up for the patient portal called "MyChart".  Sign up information is provided on this After Visit Summary.  MyChart is used to connect with patients for Virtual Visits (Telemedicine).  Patients are able to view lab/test results, encounter notes, upcoming appointments, etc.  Non-urgent messages can be sent to your provider as well.   To learn more about what you can do with MyChart, go to [email or web address].    Your next appointment:   6 week(s)  Provider:   Jari Favre, PA-C, Robin Searing, NP, Eligha Bridegroom, NP, or Tereso Newcomer, PA-C     Then, Tessa Lerner, DO will plan to see you again in 1 year(s), or sooner if needed.

## 2023-09-25 NOTE — Progress Notes (Signed)
Cardiology Office Note:  .   Date:  09/25/2023  ID:  Jillian Rodriguez, DOB [birth year], MRN [id number] PCP:  Creola Corn, MD  [facility] Providers Cardiologist:  Tessa Lerner, DO , [facility] (established care August 2021)  Electrophysiologist:  None  Click to update primary MD,subspecialty MD or APP then REFRESH:1}    History of Present Illness: .   Jillian Rodriguez is a 62 y.o. Caucasian female whose past medical history and cardiovascular risk factors includes: Fibromyalgia, hypothyroidism, hypertension, postmenopausal female.  Patient being followed in the practice for history of bradycardia.  She presents today with a chief complaint of chest pain and 1 year follow-up visit.  Over the last 1 year she has not experienced any symptoms of symptomatic bradycardia but does endorse chest pain.  Chest pain: Occurs intermittently. Noticeable at rest but not always with exertion. Intensity 4 out of 10. Describes it as tightness/heaviness. Located substernally. Self-limited Associated with dyspnea on exertion No significant change in overall physical endurance.  Patient denies heart failure symptoms.  Review of Systems: .   Review of Systems  Cardiovascular:  Positive for chest pain and dyspnea on exertion. Negative for claudication, irregular heartbeat, leg swelling, near-syncope, orthopnea, palpitations, paroxysmal nocturnal dyspnea and syncope.  Respiratory:  Negative for shortness of breath.   Hematologic/Lymphatic: Negative for bleeding problem.  Musculoskeletal:  Negative for muscle cramps and myalgias.    Studies Reviewed:   EKG: The ekg ordered today was personally reviewed by me.  EKG Interpretation Date/Time:  Tuesday September 25 2023 10:14:50 EDT Ventricular Rate:  53 PR Interval:  146 QRS Duration:  90 QT Interval:  434 QTC Calculation: 407 R Axis:   22  Text Interpretation: Sinus bradycardia When compared with ECG of 09-Oct-2019 21:18, PREVIOUS ECG IS PRESENT  Confirmed by Odis Hollingshead, Cybele Maule ([id number]) on 09/25/2023 10:30:09 AM   Echocardiogram: 08/09/2022:  Normal LV systolic function with visual EF 60-65%. Left ventricle cavity is normal in size. Mild concentric hypertrophy of the left ventricle. Normal global wall motion. Normal diastolic filling pattern, normal LAP.  Left atrial cavity is normal in size. A lipomatous septum is present.  Structurally normal mitral valve.  Mild to moderate mitral regurgitation.  Structurally normal tricuspid valve with no regurgitation. No evidence of pulmonary hypertension.    Stress Testing: Exercise Myoview stress test 08/18/2020:  Exercise nuclear stress test was performed using Bruce protocol. Patient reached 13.4 METS, and 87% of age predicted maximum heart rate. Exercise capacity was excellent. Chest pain not reported. Heart rate and hemodynamic response were normak. Stress EKG revealed no ischemic changes.  Normal myocardial perfusion. Stress LVEF 70%.  Low risk study.    Exercise treadmill stress test 07/06/2022: Exercise treadmill stress test performed using Bruce protocol.  Patient reached 10.1 METS, and 88% of age predicted maximum heart rate.  Exercise capacity was excellent.  No chest pain reported.  Normal heart rate and hemodynamic response. Stress EKG revealed no ischemic changes. Low risk study.   Coronary calcium scoring study 08/18/2020 at [facility]:  Total CAC score: 0    Heart Catheterization: None    Cardiac monitor (Zio Patch): June 28, 2022-July 01, 2022 Dominant rhythm sinus, followed by bradycardia (burden 13%). Heart rate 40-139 bpm.  Avg HR 64 bpm. Minimum heart rate 40 bpm, on 06/29/2022, at 5:42 AM. No atrial fibrillation, ventricular tachycardia, high grade AV block, pauses (3 seconds or longer). Total ventricular ectopic burden <1%. Total supraventricular ectopic burden <1%. Patient triggered events: 0.  Risk Assessment/Calculations:   NA   Labs:       Latest Ref Rng & Units  10/09/2019    3:16 PM 03/02/2009    9:19 AM 05/07/2007   11:12 AM  CBC  WBC 4.0 - 10.5 K/uL 7.7   7.4   Hemoglobin 12.0 - 15.0 g/dL 09.8  11.9  14.7   Hematocrit 36.0 - 46.0 % 41.9   39.8   Platelets 150 - 400 K/uL 375   416        Latest Ref Rng & Units 10/09/2019    3:16 PM 05/07/2007   11:12 AM  BMP  Glucose 70 - 99 mg/dL 90  88   BUN 6 - 20 mg/dL 10  9   Creatinine 8.29 - 1.00 mg/dL 5.62  0.8   Sodium 130 - 145 mmol/L 140  140   Potassium 3.5 - 5.1 mmol/L 3.8  4.9   Chloride 98 - 111 mmol/L 102  105   CO2 22 - 32 mmol/L 28  30   Calcium 8.9 - 10.3 mg/dL 9.9  9.8       Latest Ref Rng & Units 10/09/2019    3:16 PM 05/07/2007   11:12 AM  CMP  Glucose 70 - 99 mg/dL 90  88   BUN 6 - 20 mg/dL 10  9   Creatinine 8.65 - 1.00 mg/dL 7.84  0.8   Sodium 696 - 145 mmol/L 140  140   Potassium 3.5 - 5.1 mmol/L 3.8  4.9   Chloride 98 - 111 mmol/L 102  105   CO2 22 - 32 mmol/L 28  30   Calcium 8.9 - 10.3 mg/dL 9.9  9.8     No results found for: "CHOL", "HDL", "LDLCALC", "LDLDIRECT", "TRIG", "CHOLHDL" No results for input(s): "LIPOA" in the last 8760 hours. No components found for: "NTPROBNP" No results for input(s): "PROBNP" in the last 8760 hours. No results for input(s): "TSH" in the last 8760 hours.   Physical Exam:    Today's Vitals   09/25/23 1011  BP: 122/80  Pulse: (!) 53  Resp: 16  SpO2: 97%  Weight: 157 lb 3.2 oz (71.3 kg)  Height: 5\' 7"  (1.702 m)   Body mass index is 24.62 kg/m. Wt Readings from Last 3 Encounters:  09/25/23 157 lb 3.2 oz (71.3 kg)  08/30/23 160 lb (72.6 kg)  08/23/23 160 lb (72.6 kg)    Physical Exam  Constitutional: No distress.  hemodynamically stable  Neck: No JVD present.  Cardiovascular: Normal rate, regular rhythm, S1 normal, S2 normal, intact distal pulses and normal pulses. Exam reveals no gallop, no S3 and no S4.  No murmur heard. Pulmonary/Chest: Effort normal and breath sounds normal. No stridor. She has no wheezes. She has no  rales.  Abdominal: Soft. Bowel sounds are normal. She exhibits no distension. There is no abdominal tenderness.  Musculoskeletal:        General: No edema.     Cervical back: Neck supple.  Neurological: She is alert and oriented to person, place, and time. She has intact cranial nerves (2-12).  Skin: Skin is warm and moist.     Impression & Recommendation(s):  Impression:   ICD-10-CM   1. Precordial pain  R07.2 CT CORONARY MORPH W/CTA COR W/SCORE W/CA W/CM &/OR WO/CM    2. Other form of dyspnea  R06.09 CT CORONARY MORPH W/CTA COR W/SCORE W/CA W/CM &/OR WO/CM    3. Asymptomatic bradycardia  R00.1 EKG 12-Lead  4. Pre-procedure lab exam  Z01.812 Basic Metabolic Panel (BMET)    5. Benign hypertension  I10     6. Nonrheumatic mitral valve regurgitation  I34.0        Recommendation(s):  Precordial pain Dyspnea on exertion  Symptoms of precordial discomfort have both cardiac and noncardiac features. EKG does not illustrate underlying ischemia or injury pattern. Prior echo and stress test results reviewed. Shared decision was to proceed with coronary CTA to evaluate for obstructive disease.  Risks, benefits, alternatives and limitations discussed. Lopressor 12.5 mg p.o. twice daily-start 1 week prior to coronary CTA. Check BMP prior to coronary CTA.  Asymptomatic bradycardia Remains asymptomatic. No identifiable reversible cause. Monitor for now.  Benign hypertension Office blood pressures are very well-controlled, at goal.  Nonrheumatic mitral valve regurgitation Currently asymptomatic. Noted to be mild/moderate back in August 2023.   Orders Placed:  Orders Placed This Encounter  Procedures   CT CORONARY MORPH W/CTA COR W/SCORE W/CA W/CM &/OR WO/CM    Standing Status:   Future    Standing Expiration Date:   09/24/2024    Order Specific Question:   If indicated for the ordered procedure, I authorize the administration of contrast media per Radiology protocol     Answer:   Yes    Order Specific Question:   Initiate Coronary CTA Adult Protocol    Answer:   Yes    Order Specific Question:   If indicated initiate Post Coronary CTA Hypotension Adult Protocol    Answer:   Yes    Order Specific Question:   Does the patient have a contrast media/X-ray dye allergy?    Answer:   No    Order Specific Question:   Authorization:    Answer:   FFR will be ordered if deemed medically necessary   Basic Metabolic Panel (BMET)   EKG 12-Lead    As part of medical decision making results of the echo, nuclear stress test from 2021, GXT from July 2023, were reviewed independently at today's visit.   Final Medication List:    Meds ordered this encounter  Medications   metoprolol tartrate (LOPRESSOR) 25 MG tablet    Sig: Take 0.5 tablets (12.5 mg total) by mouth 2 (two) times daily. Take this medication for one week prior to your coronary CTA, then stop taking this medication.    Dispense:  7 tablet    Refill:  0    Medications Discontinued During This Encounter  Medication Reason   estradiol (VIVELLE-DOT) 0.1 MG/24HR patch Change in therapy     Current Outpatient Medications:    ALPRAZolam (XANAX) 0.25 MG tablet, Take 0.25 mg by mouth daily as needed., Disp: , Rfl:    b complex vitamins tablet, Take 1 tablet by mouth daily., Disp: , Rfl:    CALCIUM-MAGNESIUM-ZINC PO, Take 1 tablet by mouth daily., Disp: , Rfl:    Cholecalciferol (VITAMIN D3 PO), Take by mouth. 3000 daily, Disp: , Rfl:    cycloSPORINE (RESTASIS) 0.05 % ophthalmic emulsion, INSTILL 1 DROP IN BOTH EYES TWICE DAILY AS DIRECTED, Disp: , Rfl:    DULoxetine (CYMBALTA) 30 MG capsule, Take 30 mg by mouth daily., Disp: , Rfl:    estradiol (VIVELLE-DOT) 0.075 MG/24HR, , Disp: , Rfl:    Levocetirizine Dihydrochloride (XYZAL PO), Take by mouth., Disp: , Rfl:    metoprolol tartrate (LOPRESSOR) 25 MG tablet, Take 0.5 tablets (12.5 mg total) by mouth 2 (two) times daily. Take this medication for one week  prior to your  coronary CTA, then stop taking this medication., Disp: 7 tablet, Rfl: 0   Multiple Vitamin (MULTIVITAMIN WITH MINERALS) TABS tablet, Take 1 tablet by mouth daily., Disp: , Rfl:    Omega-3 Fatty Acids (FISH OIL PO), Take 1 capsule by mouth daily., Disp: , Rfl:    oxybutynin (DITROPAN-XL) 10 MG 24 hr tablet, Take 10 mg by mouth daily., Disp: , Rfl:    pantoprazole (PROTONIX) 40 MG tablet, Take 1 tablet (40 mg total) by mouth 2 (two) times daily., Disp: 180 tablet, Rfl: 3   rOPINIRole (REQUIP) 0.25 MG tablet, Take 0.25 mg by mouth at bedtime. , Disp: , Rfl:   Consent:      None.  Disposition:   Return in about 6 weeks (around 11/06/2023) for Reevaluation of, Chest pain, Review test results. or sooner if needed.  Recommend 1 year follow-up with myself.  Her questions and concerns were addressed to her satisfaction. She voices understanding of the recommendations provided during this encounter.    Signed, Tessa Lerner, DO, [facility]  [facility]  [address] [place], [place] [address] [phone] 09/25/2023 12:59 PM

## 2023-09-26 LAB — BASIC METABOLIC PANEL
BUN/Creatinine Ratio: 12 (ref 12–28)
BUN: 10 mg/dL (ref 8–27)
CO2: 24 mmol/L (ref 20–29)
Calcium: 9.4 mg/dL (ref 8.7–10.3)
Chloride: 101 mmol/L (ref 96–106)
Creatinine, Ser: 0.86 mg/dL (ref 0.57–1.00)
Glucose: 89 mg/dL (ref 70–99)
Potassium: 4.5 mmol/L (ref 3.5–5.2)
Sodium: 140 mmol/L (ref 134–144)
eGFR: 77 mL/min/{1.73_m2} (ref >59)

## 2023-09-29 ENCOUNTER — Other Ambulatory Visit: Payer: Self-pay | Admitting: Cardiology

## 2023-10-01 ENCOUNTER — Telehealth: Payer: Self-pay | Admitting: Cardiology

## 2023-10-01 ENCOUNTER — Encounter (HOSPITAL_COMMUNITY): Payer: Self-pay

## 2023-10-01 NOTE — Telephone Encounter (Signed)
Pt c/o medication issue:  1. Name of Medication:   metoprolol tartrate (LOPRESSOR) 25 MG tablet    2. How are you currently taking this medication (dosage and times per day)?  Take 0.5 tablets (12.5 mg total) by mouth 2 (two) times daily. Take this medication for one week prior to your coronary CTA, then stop taking this medication.       3. Are you having a reaction (difficulty breathing--STAT)? No  4. What is your medication issue? Pt is requesting a callback regarding this medication causing her to have headaches, stomach hurting, real bad hot flashes. Please advise

## 2023-10-01 NOTE — Telephone Encounter (Signed)
Returned Pts call. Pt states she started feeling bad last night. She states she "hurt everywhere", head is hurting and stomach pains. Pt stated she threw up this morning. Pt has not took her metoprolol this morning for her procedure on the 9th. Pt wanted Dr Odis Hollingshead to know. Told pt I would check with Dr Odis Hollingshead and see what can be done but in the mean time to please call her PCP.

## 2023-10-02 NOTE — Telephone Encounter (Signed)
Please call and find out how he is doing?  His symptoms less likely from metoprolol. If he hasn't been taking the beta blocker(s) likely will need to re-schedule his CCTA.   Ask what is pulse is running at home?  Jillian Rodriguez [place], DO, [facility]

## 2023-10-02 NOTE — Telephone Encounter (Signed)
Spoke with patient and she stated she missed 2 doses. She states after speaking with the nurse and she went to see her PCP and they diagnosed her with food poisoning. She did miss 2 doses. Can she still do CTA tomorrow?

## 2023-10-02 NOTE — Telephone Encounter (Signed)
Pt calling back to f/u on what she needs to do regarding procedure. Please advise

## 2023-10-03 ENCOUNTER — Ambulatory Visit (HOSPITAL_COMMUNITY)
Admission: RE | Admit: 2023-10-03 | Discharge: 2023-10-03 | Disposition: A | Discharge status: Home / Self Care | Payer: No Typology Code available for payment source | Source: Ambulatory Visit | Attending: Cardiology | Admitting: Cardiology

## 2023-10-03 DIAGNOSIS — R0609 Other forms of dyspnea: Secondary | ICD-10-CM | POA: Diagnosis present

## 2023-10-03 DIAGNOSIS — R072 Precordial pain: Secondary | ICD-10-CM | POA: Diagnosis present

## 2023-10-03 MED ORDER — NITROGLYCERIN 0.4 MG SL SUBL
SUBLINGUAL_TABLET | SUBLINGUAL | Status: AC
  Filled 2023-10-03: qty 2

## 2023-10-03 MED ORDER — NITROGLYCERIN 0.4 MG SL SUBL
0.8000 mg | SUBLINGUAL_TABLET | Freq: Once | SUBLINGUAL | Status: AC
  Administered 2023-10-03: 0.8 mg via SUBLINGUAL

## 2023-10-03 MED ORDER — IOHEXOL 350 MG/ML SOLN
100.0000 mL | Freq: Once | INTRAVENOUS | Status: AC | PRN
  Administered 2023-10-03: 100 mL via INTRAVENOUS

## 2023-10-03 NOTE — Telephone Encounter (Signed)
Please have her follow up w/ either her PCP regarding the thigh/knee pain.   Regards,   Lorraine Terriquez [place], DO, [facility]

## 2023-10-03 NOTE — Telephone Encounter (Signed)
Patient is calling again to get update on this. Patient is scheduled to have CTA at 3:30 P.M. today. Please advise.

## 2023-10-03 NOTE — Telephone Encounter (Signed)
Called patient back. Patient has been taking metoprolol since her food poison and only missed a few doses. Patient also called because she has been having a sharp, burning pain in her left thigh right above her knee. Patient stated it come and goes. Patient denies any redness, hot to touch, and swelling in the area. Informed her that we would send a message to Dr. Odis Hollingshead for advisement.

## 2023-10-03 NOTE — Progress Notes (Signed)
Patient tolerated CT well. Vital signs stable encourage to drink water throughout day.Reasons explained and verbalized understanding. Ambulated steady gait.   

## 2023-10-04 ENCOUNTER — Telehealth: Payer: Self-pay | Admitting: Internal Medicine

## 2023-10-04 NOTE — Telephone Encounter (Signed)
Patient requesting to speak with a nurse in regards to having abdominal pain. Please advise.

## 2023-10-04 NOTE — Telephone Encounter (Signed)
Spoke with patient and she PCP regarding her thigh and knee pain

## 2023-10-05 NOTE — Telephone Encounter (Signed)
Pt had some nausea and vomited on Monday. That same day she ran a fever of 102. She saw NP at her PCP office and was told she probably had food poisoning. She got on the internet and was concerned that she may have a blockage. She states she is constipated and having some bloating. She has passed some stool but does not feel she emptied completely. Discussed with her that she is not obstructed if she passed some stool. Recommended pt take miralax 1-3 doses/day to have bm. Pt verbalized understanding.

## 2023-10-23 ENCOUNTER — Ambulatory Visit: Payer: No Typology Code available for payment source | Admitting: Internal Medicine

## 2023-10-23 ENCOUNTER — Encounter: Payer: Self-pay | Admitting: Internal Medicine

## 2023-10-23 VITALS — BP 118/76 | HR 91 | Ht 67.0 in | Wt 157.0 lb

## 2023-10-23 DIAGNOSIS — R1319 Other dysphagia: Secondary | ICD-10-CM

## 2023-10-23 DIAGNOSIS — K222 Esophageal obstruction: Secondary | ICD-10-CM | POA: Diagnosis not present

## 2023-10-23 DIAGNOSIS — K219 Gastro-esophageal reflux disease without esophagitis: Secondary | ICD-10-CM

## 2023-10-23 MED ORDER — PANTOPRAZOLE SODIUM 40 MG PO TBEC: 40.0000 mg | DELAYED_RELEASE_TABLET | Freq: Two times a day (BID) | ORAL | 3 refills | Status: DC

## 2023-10-23 NOTE — Progress Notes (Signed)
HISTORY OF PRESENT ILLNESS:  Jillian Rodriguez is a 62 y.o. female who was last evaluated in the office August 23, 2023 regarding GERD with breakthrough symptoms, epigastric discomfort, and intermittent solid food dysphagia.  Also, globus type sensation.  She also has a history of constipation.  At the time of her last visit pantoprazole was increased to 40 mg twice daily.  She was told to initiate Citrucel for constipation.  Upper endoscopy was scheduled and performed August 30, 2023.  She was found to have a distal esophageal stricture as well as a moderate hiatal hernia.  Esophagus was dilated with a 54 [place] Maloney dilator.  She was told to continue current medication without change and follow-up at this time.  Patient tells me that she has had significant improvement in her reflux with increased dose of PPI.  Dysphagia has resolved.  She still notices a globus type sensation and some vague mild upper abdominal discomfort and bloating.  REVIEW OF SYSTEMS:  All non-GI ROS negative except for sinus and allergy trouble, anxiety, back pain, hematuria (to see a urologist), confusion, depression, fatigue, headaches, hearing problems, itching, muscle cramps, night sweats, sleeping problems, ankle edema, excessive urination, urinary frequency  Past Medical History:  Diagnosis Date   Allergy    Anxiety    Cataract    Depression    Fibromyalgia    Gallstones    GERD (gastroesophageal reflux disease)    Hyperlipidemia    IBS (irritable bowel syndrome)     Past Surgical History:  Procedure Laterality Date   CATARACT EXTRACTION Bilateral    CESAREAN SECTION     x 1   CHOLECYSTECTOMY     LAPAROSCOPY     endometriosis and uterine polyp removal   OVARIAN CYST REMOVAL     VAGINAL HYSTERECTOMY  04/2007   total   WRIST FRACTURE SURGERY Right     Social History OLEVA KIPP  reports that she has never smoked. She has never used smokeless tobacco. She reports current alcohol use. She reports  that she does not use drugs.  family history includes Atrial fibrillation in her mother; Breast cancer in her paternal grandmother; Colon polyps in her father; Diabetes in her mother; Heart disease in her father, maternal grandfather, mother, and paternal uncle; Hypertension in her father; Kidney disease in her father.  Allergies  Allergen Reactions   Penicillins Other (See Comments)    Did it involve swelling of the face/tongue/throat, SOB, or low BP? N/A Did it involve sudden or severe rash/hives, skin peeling, or any reaction on the inside of your mouth or nose? N/A Did you need to seek medical attention at a hospital or doctor's office? N/A When did it last happen?  Child If all above answers are "NO", may proceed with cephalosporin use.         PHYSICAL EXAMINATION: Vital signs: BP 118/76   Pulse 91   Ht 5\' 7"  (1.702 m)   Wt 157 lb (71.2 kg)   BMI 24.59 kg/m   Constitutional: generally well-appearing, no acute distress Psychiatric: alert and oriented x3, cooperative Eyes: Anicteric Abdomen: Not reexamined Extremities: no lower extremity edema bilaterally Skin: no lesions on visible extremities Neuro: Grossly intact  ASSESSMENT:  1.  GERD complicated by peptic stricture.  Asymptomatic post dilation on PPI 2.  Colonoscopy August 2018 without neoplasia 3.  Functional constipation 4.  General Medical problems   PLAN:  1.  Reflux precautions 2.  Continue twice daily PPI.  Medication refill 3.  Routine screening colonoscopy around 2020 4.  Continue fiber 5.  Routine office follow-up 1 year.  Contact the office in the interim for any questions or problems.

## 2023-10-23 NOTE — Patient Instructions (Signed)
We have sent the following medications to your pharmacy for you to pick up at your convenience:  Protonix  Please follow up in one year  _______________________________________________________  If your blood pressure at your visit was 140/90 or greater, please contact your primary care physician to follow up on this.  _______________________________________________________  If you are age 62 or older, your body mass index should be between 23-30. Your Body mass index is 24.59 kg/m. If this is out of the aforementioned range listed, please consider follow up with your Primary Care Provider.  If you are age 95 or younger, your body mass index should be between 19-25. Your Body mass index is 24.59 kg/m. If this is out of the aformentioned range listed, please consider follow up with your Primary Care Provider.   ________________________________________________________  The [facility] providers would like to encourage you to use [facility] to communicate with providers for non-urgent requests or questions.  Due to long hold times on the telephone, sending your provider a message by [facility] may be a faster and more efficient way to get a response.  Please allow 48 business hours for a response.  Please remember that this is for non-urgent requests.  _______________________________________________________

## 2023-10-30 ENCOUNTER — Encounter: Payer: Self-pay | Admitting: Podiatry

## 2023-10-30 ENCOUNTER — Ambulatory Visit: Payer: No Typology Code available for payment source | Admitting: Podiatry

## 2023-10-30 DIAGNOSIS — M79674 Pain in right toe(s): Secondary | ICD-10-CM | POA: Diagnosis not present

## 2023-10-30 DIAGNOSIS — M79675 Pain in left toe(s): Secondary | ICD-10-CM

## 2023-10-30 DIAGNOSIS — L600 Ingrowing nail: Secondary | ICD-10-CM | POA: Diagnosis not present

## 2023-10-30 NOTE — Progress Notes (Unsigned)
Subjective:  Patient ID: Jillian Rodriguez, female    DOB: 1961-01-30,  MRN: [id number]  Jillian Rodriguez presents to clinic today for:  Chief Complaint  Patient presents with   Ingrown Toenail    PATIENT STATES HER LEFT HALLUX TOE IS THE MAIN ONE BUT HER RIGHT HALLUX DOES IT TOO , WHERE HER HALLUX STARTS TO HURT ON THE SIDES OF BILATERAL HALLUX  AND IT IS REAL TENDER TO THE TOUCH . THIS HAS BEEN GOING ON FOR YEARS NOW .  PATIENT STATES SHE TAKES TYLENOL FOR PAIN.   Patient has ingrown nails on both great toes.  Gets pedicures monthly to address this issue.  Notes the left toenail is worse than the right and only on the medial border.  She thinks it might be both borders on the right great toenail, but states they aren't really hurting today.   PCP is Creola Corn, MD.  Allergies  Allergen Reactions   Penicillins Other (See Comments)    Did it involve swelling of the face/tongue/throat, SOB, or low BP? N/A Did it involve sudden or severe rash/hives, skin peeling, or any reaction on the inside of your mouth or nose? N/A Did you need to seek medical attention at a hospital or doctor's office? N/A When did it last happen?  Child If all above answers are "NO", may proceed with cephalosporin use.      Review of Systems: Negative except as noted in the HPI.  Objective:  Jillian Rodriguez is a pleasant 62 y.o. female in NAD. AAO x 3.  Vascular Examination: Capillary refill time is 3-5 seconds to toes bilateral. Palpable pedal pulses b/l LE. Digital hair present b/l. No pedal edema b/l. Skin temperature gradient WNL b/l. No varicosities b/l. No cyanosis or clubbing noted b/l.   Dermatological Examination: There is incurvation of the left hallux medial nail border.  There is pain on palpation of the affected nail border.  No drainage is noted.    Neurological Examination: Epicritic sensation intact b/l LE.   Assessment/Plan: 1. Ingrown toenail   2. Pain in toes of both feet     Discussed patient's condition today.  After obtaining patient consent, the left great toe was anesthetized with a 50:50 mixture of 1% lidocaine plain and 0.5% bupivacaine plain for a total of 3cc's administered.  Upon confirmation of anesthesia, a freer elevator was utilized to free the left hallux medial nail border from the nail bed.  The nail border was then avulsed proximal to the eponychium and removed in toto.  The area was inspected for any remaining spicules.  A chemical matrixectomy was performed with NaOH and neutralized with acetic acid solution.  Antibiotic ointment and a DSD were applied, followed by a Coban dressing.  Patient tolerated the anesthetic and procedure well and will f/u in 2-3 weeks for recheck.  Patient given post-procedure instructions for daily 15-minute Epsom salt soaks, antibiotic ointment and daily use of Bandaids until toe starts to dry / form eschar.    Return in about 2 weeks (around 11/13/2023) for PNA recheck and possible PNA R-1.   Clerance Lav, DPM, FACFAS [facility]     [address], [place] [address]  Office [phone]  Fax [phone]

## 2023-10-30 NOTE — Patient Instructions (Signed)

## 2023-11-06 ENCOUNTER — Ambulatory Visit: Payer: No Typology Code available for payment source | Admitting: Physician Assistant

## 2023-11-09 ENCOUNTER — Telehealth: Payer: Self-pay

## 2023-11-09 NOTE — Telephone Encounter (Signed)
Patient called, concerned that she was still having drainage after her nail procedure on 10/30/23. Advised that the chemical used to prevent regrowth of the nail can cause this type of drainage for up to 6 weeks. Advised to watch for signs of infection - redness, swelling, fever and increased drainage. Patient voiced understanding. She will call back with any further questions Thanks

## 2023-11-13 ENCOUNTER — Ambulatory Visit (INDEPENDENT_AMBULATORY_CARE_PROVIDER_SITE_OTHER): Payer: No Typology Code available for payment source | Admitting: Podiatry

## 2023-11-13 ENCOUNTER — Encounter: Payer: Self-pay | Admitting: Podiatry

## 2023-11-13 DIAGNOSIS — L03032 Cellulitis of left toe: Secondary | ICD-10-CM | POA: Diagnosis not present

## 2023-11-13 DIAGNOSIS — L600 Ingrowing nail: Secondary | ICD-10-CM | POA: Diagnosis not present

## 2023-11-13 MED ORDER — DOXYCYCLINE HYCLATE 100 MG PO CAPS
100.0000 mg | ORAL_CAPSULE | Freq: Two times a day (BID) | ORAL | 0 refills | Status: AC
Start: 2023-11-13 — End: 2023-11-20

## 2023-11-13 NOTE — Progress Notes (Signed)
       Subjective:  Patient ID: Jillian Rodriguez, female    DOB: [birth year],  MRN: [id number]  Chief Complaint  Patient presents with   Routine Post Op    PATIENT STATES SHE IS HERE FOR  2 WEEK F/U AND  HAS A LITTLE DISCOLOR TO HER L HALLUX AND STILL TENDER . VERY LITTLE BLOOD ON THE INSIDE OF HER L HALLUX .  PATIENT STATES SHE HAS NO PAIN JUST TENDER .     Jillian Rodriguez presents to clinic today for f/u of PNA to the left hallux medial and lateral borders.  She notes she is not having any tenderness along the medial border but the proximal portion of the lateral nail border is still tender and much pink.  She does still have drainage from that area.  PCP is Jillian Corn, MD.  Allergies  Allergen Reactions   Penicillins Other (See Comments)    Did it involve swelling of the face/tongue/throat, SOB, or low BP? N/A Did it involve sudden or severe rash/hives, skin peeling, or any reaction on the inside of your mouth or nose? N/A Did you need to seek medical attention at a hospital or doctor's office? N/A When did it last happen?  Child If all above answers are "NO", may proceed with cephalosporin use.      Objective:  Vascular Examination: Capillary refill time is 3-5 seconds to toes bilateral. Palpable pedal pulses b/l LE. Digital hair present b/l. No pedal edema b/l. Skin temperature gradient WNL b/l. No varicosities b/l. No cyanosis or clubbing noted b/l.   Dermatological Examination: Upon inspection of the PNA site, there are no clinical signs of infection.  No purulence, no necrosis, no malodor present.  Minimal to no erythema present due to sodium hydroxide chemical reaction.  Eschar forming along the medial nail margin.  There is some granular tissue along the lateral nail margin.  Purulence is noted.  No necrosis is seen.  Assessment/Plan: 1. Cellulitis of left toe   2. Ingrown toenail     Meds ordered this encounter  Medications   doxycycline (VIBRAMYCIN) 100 MG capsule     Sig: Take 1 capsule (100 mg total) by mouth 2 (two) times daily for 7 days.    Dispense:  14 capsule    Refill:  0   Will have patient discontinue the triple antibiotic ointment she is using at home.  I will have her switch to with Betadine/iodine solution to the area daily followed by a dry Band-Aid.  She will remove any covering at that time to let it air out and form a scab.  Will also send in doxycycline for any early signs of infection that are showing today.  Follow-up with phone call on how she is doing within the next 1 to 2 weeks.    Return if symptoms worsen or fail to improve.   Clerance Lav, DPM, FACFAS [facility]     [address] [place], [place] [address]                Office [phone]  Fax [phone]

## 2023-12-07 ENCOUNTER — Telehealth: Payer: Self-pay

## 2023-12-07 NOTE — Telephone Encounter (Signed)
Patient called and left a message- Left toe is puffy and red again. Got better after antibiotics in November. Patient was told that there are no available appointments until Jaunuary. She is asking if she should soak or use peroxide on the toe. Does she need another round of antibiotics? Please advise -thanks

## 2023-12-10 MED ORDER — DOXYCYCLINE HYCLATE 100 MG PO CAPS
100.0000 mg | ORAL_CAPSULE | Freq: Two times a day (BID) | ORAL | 0 refills | Status: AC
Start: 2023-12-10 — End: 2023-12-20

## 2023-12-10 NOTE — Addendum Note (Signed)
Addended byLucia Estelle D on: 12/10/2023 08:19 PM   Modules accepted: Orders

## 2024-01-16 ENCOUNTER — Encounter: Payer: Self-pay | Admitting: Podiatrist

## 2024-01-16 ENCOUNTER — Ambulatory Visit: Payer: No Typology Code available for payment source | Admitting: Podiatrist

## 2024-01-16 DIAGNOSIS — L609 Nail disorder, unspecified: Secondary | ICD-10-CM

## 2024-01-16 NOTE — Progress Notes (Signed)
Chief Complaint  Patient presents with   Nail Problem    Hallux left - ingrown procedure 11/13/23, now noticing the nail is discolored and splitting towards the base     HPI: Patient is 63 y.o. female who presents today for follow up of her hallux ingrown nail procedure left great toe.  She relates the base of the nail looks split and she wants to be sure it is healing ok.     Allergies  Allergen Reactions   Penicillins Other (See Comments)    Did it involve swelling of the face/tongue/throat, SOB, or low BP? N/A Did it involve sudden or severe rash/hives, skin peeling, or any reaction on the inside of your mouth or nose? N/A Did you need to seek medical attention at a hospital or doctor's office? N/A When did it last happen?  Child If all above answers are "NO", may proceed with cephalosporin use.      Review of systems is negative except as noted in the HPI.  Denies nausea/ vomiting/ fevers/ chills or night sweats.   Denies difficulty breathing, denies calf pain or tenderness  Physical Exam  Patient is awake, alert, and oriented x 3.  In no acute distress.    Vascular status is intact with palpable pedal pulses DP and PT bilateral and capillary refill time less than 3 seconds bilateral.  No edema or erythema noted.   Neurological exam reveals epicritic and protective sensation grossly intact bilateral.   Dermatological exam reveals skin is supple and dry to bilateral feet.  Well healing nail status post ingrown procedure-  the borders are healing in a normal fashion but have a ridge where the new nail is growing out from the surgical repair.  No redness, swelling or sign of infection noted.  Nail should go on to heal normally after a few months.        Assessment:   ICD-10-CM   1. Nail problem  L60.9        Plan: Discussed nail growth from her procedure and explained that this is a normal process in the healing.  Also pared down the loose areas and filed the split  portion of the nail with a sterile burr to help improve appearance of the nail.  She will call in the future if any problems arise.

## 2024-05-26 ENCOUNTER — Other Ambulatory Visit: Payer: Self-pay | Admitting: Internal Medicine

## 2024-05-26 DIAGNOSIS — K7689 Other specified diseases of liver: Secondary | ICD-10-CM

## 2024-06-03 ENCOUNTER — Other Ambulatory Visit

## 2024-06-05 ENCOUNTER — Ambulatory Visit
Admission: RE | Admit: 2024-06-05 | Discharge: 2024-06-05 | Disposition: A | Discharge status: Home / Self Care | Source: Ambulatory Visit | Attending: Internal Medicine | Admitting: Internal Medicine

## 2024-06-05 DIAGNOSIS — K7689 Other specified diseases of liver: Secondary | ICD-10-CM

## 2024-10-08 ENCOUNTER — Emergency Department (HOSPITAL_BASED_OUTPATIENT_CLINIC_OR_DEPARTMENT_OTHER)

## 2024-10-08 ENCOUNTER — Other Ambulatory Visit: Payer: Self-pay

## 2024-10-08 ENCOUNTER — Emergency Department (HOSPITAL_BASED_OUTPATIENT_CLINIC_OR_DEPARTMENT_OTHER)
Admission: EM | Admit: 2024-10-08 | Discharge: 2024-10-08 | Disposition: A | Discharge status: Home / Self Care | Attending: Emergency Medicine | Admitting: Emergency Medicine

## 2024-10-08 ENCOUNTER — Encounter (HOSPITAL_BASED_OUTPATIENT_CLINIC_OR_DEPARTMENT_OTHER): Payer: Self-pay | Admitting: Emergency Medicine

## 2024-10-08 DIAGNOSIS — X501XXA Overexertion from prolonged static or awkward postures, initial encounter: Secondary | ICD-10-CM | POA: Diagnosis not present

## 2024-10-08 DIAGNOSIS — S93401A Sprain of unspecified ligament of right ankle, initial encounter: Secondary | ICD-10-CM | POA: Diagnosis not present

## 2024-10-08 DIAGNOSIS — S99911A Unspecified injury of right ankle, initial encounter: Secondary | ICD-10-CM | POA: Diagnosis present

## 2024-10-08 NOTE — ED Triage Notes (Signed)
 Pt via POV c/o right foot and ankle pain after she rolled her ankle stepping off a curb. No significant swelling noted but pt is limping. Pain rated 7/10, no meds PTA.

## 2024-10-08 NOTE — Discharge Instructions (Signed)
 Use the brace to help support your ankle over the next couple of weeks.  You can take over-the-counter medications for pain and discomfort.  Ice can also help alleviate swelling.  Consider following up with an orthopedic doctor if your symptoms or not improving in the next week or 2.

## 2024-10-08 NOTE — ED Provider Notes (Signed)
 Jillian Rodriguez EMERGENCY DEPARTMENT AT [facility] Provider Note   CSN: [id number] Arrival date & time: 10/08/24  1932     Patient presents with: Ankle Pain   Jillian Rodriguez is a 63 y.o. female.    Ankle Pain    Patient presents to the ED for evaluation of ankle pain.  Patient states she twisted her ankle when stepping off a curb today.  Patient has noted swelling in her right ankle and foot area.  She denies any knee pain.  Prior to Admission medications   Medication Sig Start Date End Date Taking? Authorizing Provider  ALPRAZolam (XANAX) 0.25 MG tablet Take 0.25 mg by mouth daily as needed. 02/17/20   [provider]  b complex vitamins tablet Take 1 tablet by mouth daily.    [provider]  CALCIUM-MAGNESIUM-ZINC PO Take 1 tablet by mouth daily.    [provider]  Cholecalciferol (VITAMIN D3 PO) Take by mouth. 3000 daily    [provider]  cycloSPORINE (RESTASIS) 0.05 % ophthalmic emulsion INSTILL 1 DROP IN BOTH EYES TWICE DAILY AS DIRECTED    [provider]  DULoxetine (CYMBALTA) 30 MG capsule Take 30 mg by mouth daily.    [provider]  estradiol (VIVELLE-DOT) 0.075 MG/24HR     [provider]  Levocetirizine Dihydrochloride (XYZAL PO) Take by mouth.    [provider]  metoprolol  tartrate (LOPRESSOR ) 25 MG tablet Take 0.5 tablets (12.5 mg total) by mouth 2 (two) times daily. Take this medication for one week prior to your coronary CTA, then stop taking this medication. 09/25/23   Tolia, Sunit, DO  mirabegron ER (MYRBETRIQ) 50 MG TB24 tablet Take 50 mg by mouth daily. 01/08/24   [provider]  Multiple Vitamin (MULTIVITAMIN WITH MINERALS) TABS tablet Take 1 tablet by mouth daily.    [provider]  Omega-3 Fatty Acids (FISH OIL PO) Take 1 capsule by mouth daily.    [provider]  oxybutynin (DITROPAN-XL) 10 MG 24 hr tablet Take 10 mg by mouth daily. 04/08/21    [provider]  pantoprazole  (PROTONIX ) 40 MG tablet Take 1 tablet (40 mg total) by mouth 2 (two) times daily. 10/23/23   Kiang Norleen SAILOR, MD  rOPINIRole (REQUIP) 0.25 MG tablet Take 0.25 mg by mouth at bedtime.     [provider]    Allergies: Penicillins    Review of Systems  Updated Vital Signs BP (!) 146/84 (BP Location: Right Arm)   Pulse (!) 56   Temp 98 F (36.7 C) (Oral)   Resp 15   Ht 1.715 m (5' 7.5)   Wt 71.7 kg   SpO2 95%   BMI 24.38 kg/m   Physical Exam Vitals and nursing note reviewed.  Constitutional:      General: She is not in acute distress.    Appearance: She is well-developed.  HENT:     Head: Normocephalic and atraumatic.     Right Ear: External ear normal.     Left Ear: External ear normal.  Eyes:     General: No scleral icterus.       Right eye: No discharge.        Left eye: No discharge.     Conjunctiva/sclera: Conjunctivae normal.  Neck:     Trachea: No tracheal deviation.  Cardiovascular:     Rate and Rhythm: Normal rate.  Pulmonary:     Effort: Pulmonary effort is normal. No respiratory distress.  Breath sounds: No stridor.  Abdominal:     General: There is no distension.  Musculoskeletal:        General: Swelling and tenderness present. No deformity.     Cervical back: Neck supple.     Comments: Tenderness palpation right ankle and midfoot, no proximal fibula tenderness  Skin:    General: Skin is warm and dry.     Findings: No rash.  Neurological:     Mental Status: She is alert. Mental status is at baseline.     Cranial Nerves: No dysarthria or facial asymmetry.     Motor: No seizure activity.     (all labs ordered are listed, but only abnormal results are displayed) Labs Reviewed - No data to display  EKG: None  Radiology: DG Foot Complete Right Result Date: 10/08/2024 EXAM: 3 OR MORE VIEW(S) XRAY OF THE RIGHT FOOT 10/08/2024 08:00:00 PM COMPARISON: None available. CLINICAL HISTORY: Fall. Pt via  POV c/o right foot and ankle pain after she rolled her ankle stepping off a curb. No significant swelling noted but pt is limping. Pain rated 7/10, no meds PTA. FINDINGS: BONES AND JOINTS: Normal bone mineralization. No evidence of acute fracture or dislocation. There is a small linear os peroneum alongside the cuboid bone. There is a well-corticated ossicle at the medial aspect of the great toe interphalangeal joint which could be remote chip fracture or an accessory or unfused ossification center. Arthritic changes are not seen. SOFT TISSUES: There is mild soft tissue swelling in the forefoot. IMPRESSION: 1. No acute fracture or dislocation is evident . 2. Mild soft tissue swelling in the forefoot. Electronically signed by: Francis Quam MD 10/08/2024 08:05 PM EDT RP Workstation: [id number]   DG Ankle Complete Right Result Date: 10/08/2024 EXAM: 3 or more VIEW(S) XRAY OF THE RIGHT ANKLE 10/08/2024 08:00:00 PM CLINICAL HISTORY: Fall. Pt via POV c/o right foot and ankle pain after she rolled her ankle stepping off a curb. No significant swelling noted but pt is limping. Pain rated 7/10, no meds PTA. COMPARISON: None available. FINDINGS: BONES AND JOINTS: No acute fracture tissue swelling is evident . No focal osseous lesion. No joint dislocation. SOFT TISSUES: There is mild generalized soft tissue swelling, greater laterally. IMPRESSION: 1. Soft  tissue swelling without evidence of fractures. Electronically signed by: Francis Quam MD 10/08/2024 08:03 PM EDT RP Workstation: [id number]     Procedures   Medications Ordered in the ED - No data to display  Clinical Course as of 10/08/24 2049  Wed Oct 08, 2024  2034 X-rays of foot and ankle without fracture [JK]    Clinical Course User Index [JK] Randol Simmonds, MD                                 Medical Decision Making Amount and/or Complexity of Data Reviewed Radiology: ordered.   Patient presents with ankle sprain type injury.  X-ray does not  show signs of fracture or dislocation.  Will have her wear a brace for support.  Offered crutches but patient declines.  Recommend ice NSAIDs.  Outpatient follow-up with orthopedics as needed       Final diagnoses:  Sprain of right ankle, unspecified ligament, initial encounter    ED Discharge Orders     None          Randol Simmonds, MD 10/08/24 2050

## 2024-11-29 ENCOUNTER — Other Ambulatory Visit: Payer: Self-pay | Admitting: Internal Medicine

## 2024-11-29 DIAGNOSIS — K222 Esophageal obstruction: Secondary | ICD-10-CM

## 2024-11-29 DIAGNOSIS — R1319 Other dysphagia: Secondary | ICD-10-CM

## 2024-11-29 DIAGNOSIS — K219 Gastro-esophageal reflux disease without esophagitis: Secondary | ICD-10-CM

## 2025-01-08 ENCOUNTER — Ambulatory Visit: Admitting: Cardiology

## 2025-01-20 ENCOUNTER — Ambulatory Visit: Admitting: Cardiology

## 2025-02-20 ENCOUNTER — Ambulatory Visit: Admitting: Emergency Medicine

## 2025-02-23 ENCOUNTER — Ambulatory Visit: Admitting: Neurology
